# Patient Record
Sex: Male | Born: 1940 | Race: White | Hispanic: No | Smoking: Current every day smoker
Health system: Southern US, Community
[De-identification: ages and names within clinical notes are randomized; demographics above are authoritative.]

## PROBLEM LIST (undated history)

## (undated) DIAGNOSIS — E119 Type 2 diabetes mellitus without complications: Secondary | ICD-10-CM

## (undated) DIAGNOSIS — F0390 Unspecified dementia without behavioral disturbance: Secondary | ICD-10-CM

## (undated) DIAGNOSIS — I10 Essential (primary) hypertension: Secondary | ICD-10-CM

## (undated) DIAGNOSIS — F419 Anxiety disorder, unspecified: Secondary | ICD-10-CM

## (undated) DIAGNOSIS — G8929 Other chronic pain: Secondary | ICD-10-CM

## (undated) DIAGNOSIS — M549 Dorsalgia, unspecified: Secondary | ICD-10-CM

## (undated) HISTORY — PX: APPENDECTOMY: SHX54

---

## 2010-12-18 ENCOUNTER — Emergency Department: Payer: Self-pay | Admitting: Emergency Medicine

## 2010-12-24 ENCOUNTER — Emergency Department: Payer: Self-pay | Admitting: Emergency Medicine

## 2012-03-10 ENCOUNTER — Emergency Department: Payer: Self-pay | Admitting: Emergency Medicine

## 2013-06-28 ENCOUNTER — Emergency Department: Payer: Self-pay | Admitting: Emergency Medicine

## 2013-06-28 LAB — DRUG SCREEN, URINE
Amphetamines, Ur Screen: NEGATIVE (ref ?–1000)
Barbiturates, Ur Screen: NEGATIVE (ref ?–200)
Benzodiazepine, Ur Scrn: NEGATIVE (ref ?–200)
Cannabinoid 50 Ng, Ur ~~LOC~~: NEGATIVE (ref ?–50)
Cocaine Metabolite,Ur ~~LOC~~: NEGATIVE (ref ?–300)
MDMA (ECSTASY) UR SCREEN: NEGATIVE (ref ?–500)
METHADONE, UR SCREEN: NEGATIVE (ref ?–300)
OPIATE, UR SCREEN: POSITIVE (ref ?–300)
Phencyclidine (PCP) Ur S: NEGATIVE (ref ?–25)
Tricyclic, Ur Screen: NEGATIVE (ref ?–1000)

## 2013-06-28 LAB — COMPREHENSIVE METABOLIC PANEL
ALK PHOS: 178 U/L — AB
ALT: 25 U/L (ref 12–78)
AST: 23 U/L (ref 15–37)
Albumin: 3.7 g/dL (ref 3.4–5.0)
Anion Gap: 9 (ref 7–16)
BUN: 13 mg/dL (ref 7–18)
Bilirubin,Total: 0.6 mg/dL (ref 0.2–1.0)
Calcium, Total: 9.7 mg/dL (ref 8.5–10.1)
Chloride: 100 mmol/L (ref 98–107)
Co2: 26 mmol/L (ref 21–32)
Creatinine: 0.98 mg/dL (ref 0.60–1.30)
EGFR (African American): >60
EGFR (Non-African Amer.): >60
Glucose: 190 mg/dL — ABNORMAL HIGH (ref 65–99)
Osmolality: 275 (ref 275–301)
Potassium: 3.9 mmol/L (ref 3.5–5.1)
Sodium: 135 mmol/L — ABNORMAL LOW (ref 136–145)
Total Protein: 7.7 g/dL (ref 6.4–8.2)

## 2013-06-28 LAB — CBC
HCT: 51.8 % (ref 40.0–52.0)
HGB: 17.1 g/dL (ref 13.0–18.0)
MCH: 27.3 pg (ref 26.0–34.0)
MCHC: 33 g/dL (ref 32.0–36.0)
MCV: 83 fL (ref 80–100)
PLATELETS: 241 10*3/uL (ref 150–440)
RBC: 6.25 10*6/uL — ABNORMAL HIGH (ref 4.40–5.90)
RDW: 16 % — ABNORMAL HIGH (ref 11.5–14.5)
WBC: 8.1 10*3/uL (ref 3.8–10.6)

## 2013-06-28 LAB — URINALYSIS, COMPLETE
Bacteria: NONE SEEN
Bilirubin,UR: NEGATIVE
Blood: NEGATIVE
Leukocyte Esterase: NEGATIVE
NITRITE: NEGATIVE
PH: 6 (ref 4.5–8.0)
Specific Gravity: 1.035 (ref 1.003–1.030)
WBC UR: 5 /HPF (ref 0–5)

## 2013-06-28 LAB — SALICYLATE LEVEL: SALICYLATES, SERUM: 3.5 mg/dL — AB

## 2013-06-28 LAB — ACETAMINOPHEN LEVEL

## 2013-06-28 LAB — ETHANOL
Ethanol %: <0.003 % (ref 0.000–0.080)
Ethanol: <3 mg/dL

## 2013-09-01 ENCOUNTER — Emergency Department: Payer: Self-pay | Admitting: Emergency Medicine

## 2013-09-01 LAB — ETHANOL
Ethanol %: <0.003 % (ref 0.000–0.080)
Ethanol: <3 mg/dL

## 2013-09-01 LAB — CBC WITH DIFFERENTIAL/PLATELET
BASOS ABS: 0.1 10*3/uL (ref 0.0–0.1)
BASOS PCT: 0.9 %
EOS PCT: 1.7 %
Eosinophil #: 0.2 10*3/uL (ref 0.0–0.7)
HCT: 51.3 % (ref 40.0–52.0)
HGB: 17 g/dL (ref 13.0–18.0)
LYMPHS ABS: 1.8 10*3/uL (ref 1.0–3.6)
Lymphocyte %: 20.4 %
MCH: 27.4 pg (ref 26.0–34.0)
MCHC: 33.1 g/dL (ref 32.0–36.0)
MCV: 83 fL (ref 80–100)
MONOS PCT: 6.9 %
Monocyte #: 0.6 x10 3/mm (ref 0.2–1.0)
NEUTROS PCT: 70.1 %
Neutrophil #: 6 10*3/uL (ref 1.4–6.5)
Platelet: 294 10*3/uL (ref 150–440)
RBC: 6.19 10*6/uL — ABNORMAL HIGH (ref 4.40–5.90)
RDW: 17.4 % — AB (ref 11.5–14.5)
WBC: 8.6 10*3/uL (ref 3.8–10.6)

## 2013-09-01 LAB — BASIC METABOLIC PANEL
Anion Gap: 7 (ref 7–16)
BUN: 15 mg/dL (ref 7–18)
CHLORIDE: 104 mmol/L (ref 98–107)
Calcium, Total: 8.9 mg/dL (ref 8.5–10.1)
Co2: 23 mmol/L (ref 21–32)
Creatinine: 0.89 mg/dL (ref 0.60–1.30)
EGFR (African American): >60
GLUCOSE: 164 mg/dL — AB (ref 65–99)
Osmolality: 273 (ref 275–301)
Potassium: 3.5 mmol/L (ref 3.5–5.1)
SODIUM: 134 mmol/L — AB (ref 136–145)

## 2013-09-01 LAB — TSH: THYROID STIMULATING HORM: 0.96 u[IU]/mL

## 2013-09-01 LAB — TROPONIN I

## 2013-10-25 ENCOUNTER — Emergency Department: Payer: Self-pay | Admitting: Emergency Medicine

## 2014-11-26 ENCOUNTER — Emergency Department
Admission: EM | Admit: 2014-11-26 | Discharge: 2014-11-26 | Disposition: A | Discharge status: Home / Self Care | Payer: Medicare Other | Attending: Emergency Medicine | Admitting: Emergency Medicine

## 2014-11-26 ENCOUNTER — Encounter: Payer: Self-pay | Admitting: *Deleted

## 2014-11-26 DIAGNOSIS — F419 Anxiety disorder, unspecified: Secondary | ICD-10-CM | POA: Insufficient documentation

## 2014-11-26 HISTORY — DX: Essential (primary) hypertension: I10

## 2014-11-26 HISTORY — DX: Type 2 diabetes mellitus without complications: E11.9

## 2014-11-26 HISTORY — DX: Anxiety disorder, unspecified: F41.9

## 2014-11-26 LAB — CBC WITH DIFFERENTIAL/PLATELET
Basophils Absolute: 0.1 10*3/uL (ref 0–0.1)
Basophils Relative: 1 %
Eosinophils Absolute: 0.2 10*3/uL (ref 0–0.7)
Eosinophils Relative: 2 %
HEMATOCRIT: 46.4 % (ref 40.0–52.0)
Hemoglobin: 15.6 g/dL (ref 13.0–18.0)
LYMPHS ABS: 2.2 10*3/uL (ref 1.0–3.6)
LYMPHS PCT: 23 %
MCH: 28.9 pg (ref 26.0–34.0)
MCHC: 33.7 g/dL (ref 32.0–36.0)
MCV: 85.8 fL (ref 80.0–100.0)
MONO ABS: 0.6 10*3/uL (ref 0.2–1.0)
Monocytes Relative: 6 %
Neutro Abs: 6.9 10*3/uL — ABNORMAL HIGH (ref 1.4–6.5)
Neutrophils Relative %: 68 %
PLATELETS: 214 10*3/uL (ref 150–440)
RBC: 5.41 MIL/uL (ref 4.40–5.90)
RDW: 14.7 % — ABNORMAL HIGH (ref 11.5–14.5)
WBC: 10 10*3/uL (ref 3.8–10.6)

## 2014-11-26 LAB — BASIC METABOLIC PANEL
Anion gap: 10 (ref 5–15)
BUN: 18 mg/dL (ref 6–20)
CO2: 24 mmol/L (ref 22–32)
CREATININE: 0.86 mg/dL (ref 0.61–1.24)
Calcium: 8.6 mg/dL — ABNORMAL LOW (ref 8.9–10.3)
Chloride: 103 mmol/L (ref 101–111)
GFR calc Af Amer: >60 mL/min (ref >60)
GFR calc non Af Amer: >60 mL/min (ref >60)
GLUCOSE: 136 mg/dL — AB (ref 65–99)
Potassium: 3.6 mmol/L (ref 3.5–5.1)
Sodium: 137 mmol/L (ref 135–145)

## 2014-11-26 LAB — TROPONIN I

## 2014-11-26 LAB — BRAIN NATRIURETIC PEPTIDE: B Natriuretic Peptide: 39 pg/mL (ref 0.0–100.0)

## 2014-11-26 MED ORDER — DIAZEPAM 5 MG PO TABS
10.0000 mg | ORAL_TABLET | Freq: Once | ORAL | Status: AC
  Administered 2014-11-26: 10 mg via ORAL

## 2014-11-26 MED ORDER — LORAZEPAM 1 MG PO TABS: 1.0000 mg | ORAL_TABLET | Freq: Three times a day (TID) | ORAL | Status: AC | PRN

## 2014-11-26 MED ORDER — DIAZEPAM 5 MG PO TABS
ORAL_TABLET | ORAL | Status: AC
  Administered 2014-11-26: 10 mg via ORAL
  Filled 2014-11-26: qty 2

## 2014-11-26 NOTE — ED Notes (Signed)
Pt was seen at [facility] yesterday for anxiety, pt called EMS today for anxiety, t is requesting relief from symptoms

## 2014-11-26 NOTE — ED Provider Notes (Signed)
[facility] Emergency Department Provider Note     Time seen: ----------------------------------------- 8:30 AM on 11/26/2014 -----------------------------------------    I have reviewed the triage vital signs and the nursing notes.   HISTORY  Chief Complaint Anxiety    HPI Dennis Haynes is a 74 y.o. male who presents ER for feeling anxious, patient seen at [facility] was given some IM Ativan but he was not given a prescription. Patient feeling very anxious and jittery. Does have history of same, symptoms are severe at this time. Nothing makes it worse. Onset was this morning.   No past medical history on file.  There are no active problems to display for this patient.   No past surgical history on file.  Allergies Review of patient's allergies indicates not on file.  Social History History  Substance Use Topics  . Smoking status: Not on file  . Smokeless tobacco: Not on file  . Alcohol Use: Not on file    Review of Systems Constitutional: Negative for fever. Eyes: Negative for visual changes. ENT: Negative for sore throat. Cardiovascular: Negative for chest pain. Respiratory: Negative for shortness of breath. Gastrointestinal: Negative for abdominal pain, vomiting and diarrhea. Genitourinary: Negative for dysuria. Musculoskeletal: Negative for back pain. Skin: Negative for rash. Neurological: Negative for headaches, focal weakness or numbness. Psychiatric: Positive for anxiety 10-point ROS otherwise negative.  ____________________________________________   PHYSICAL EXAM:  VITAL SIGNS: ED Triage Vitals  Enc Vitals Group     BP --      Pulse --      Resp --      Temp --      Temp src --      SpO2 --      Weight --      Height --      Head Cir --      Peak Flow --      Pain Score --      Pain Loc --      Pain Edu? --      Excl. in [place]? --     Constitutional: Alert, anxious and hyperventilating Eyes: Conjunctivae are  normal. PERRL. Normal extraocular movements. ENT   Head: Normocephalic and atraumatic.   Nose: No congestion/rhinnorhea.   Mouth/Throat: Mucous membranes are moist.   Neck: No stridor. Hematological/Lymphatic/Immunilogical: No cervical lymphadenopathy. Cardiovascular: Normal rate, regular rhythm. Normal and symmetric distal pulses are present in all extremities. No murmurs, rubs, or gallops. Respiratory: Normal respiratory effort without tachypnea nor retractions. Breath sounds are clear and equal bilaterally. No wheezes/rales/rhonchi. Gastrointestinal: Soft and nontender. No distention. No abdominal bruits. There is no CVA tenderness. Musculoskeletal: Nontender with normal range of motion in all extremities. No joint effusions.  No lower extremity tenderness nor edema. Neurologic:  Normal speech and language. No gross focal neurologic deficits are appreciated. Speech is normal. No gait instability. Skin:  Skin is warm, dry and intact. No rash noted. Psychiatric: Anxious mood, speech and behavior are otherwise unremarkable ____________________________________________  EKG: Interpreted by me. Normal sinus rhythm with a rate of 70, normal axis normal intervals, no evidence of hypertrophy or acute infarction.  ____________________________________________  ED COURSE:  Pertinent labs & imaging results that were available during my care of the patient were reviewed by me and considered in my medical decision making (see chart for details). Patient given by mouth Valium, will check basic labs and reevaluate ____________________________________________    LABS (pertinent positives/negatives)  Labs Reviewed  CBC WITH DIFFERENTIAL/PLATELET - Abnormal; Notable for  the following:    RDW 14.7 (*)    Neutro Abs 6.9 (*)    All other components within normal limits  BASIC METABOLIC PANEL - Abnormal; Notable for the following:    Glucose, Bld 136 (*)    Calcium 8.6 (*)    All other  components within normal limits  BRAIN NATRIURETIC PEPTIDE  TROPONIN I    RADIOLOGY  None  ____________________________________________  FINAL ASSESSMENT AND PLAN  Anxiety  Plan: Patient is in no acute distress, feels better after Valium. We'll discharge with Ativan in short supply to take as needed.   Earleen Newport, MD   Earleen Newport, MD 11/26/14 [phone]

## 2014-11-26 NOTE — ED Notes (Signed)
Pt informed to return if any life threatening symptoms occur.  

## 2014-11-26 NOTE — ED Notes (Signed)
Pt complains of feeling anxious, pt seen at [facility] given IM ativan per pt, pt not given prescription for ativan

## 2014-11-26 NOTE — Discharge Instructions (Signed)

## 2015-12-06 ENCOUNTER — Other Ambulatory Visit: Payer: Self-pay | Admitting: Unknown Physician Specialty

## 2015-12-07 ENCOUNTER — Other Ambulatory Visit: Payer: Self-pay | Admitting: Unknown Physician Specialty

## 2015-12-07 DIAGNOSIS — M1711 Unilateral primary osteoarthritis, right knee: Secondary | ICD-10-CM

## 2015-12-27 ENCOUNTER — Ambulatory Visit: Payer: Medicare Other

## 2016-01-08 ENCOUNTER — Ambulatory Visit: Payer: Medicare Other

## 2016-02-05 ENCOUNTER — Emergency Department: Payer: Medicare Other

## 2016-02-05 ENCOUNTER — Inpatient Hospital Stay
Admission: EM | Admit: 2016-02-05 | Discharge: 2016-02-08 | DRG: 682 | Disposition: A | Discharge status: Skilled Nursing Facility | Payer: Medicare Other | Attending: Specialist | Admitting: Specialist

## 2016-02-05 ENCOUNTER — Encounter: Payer: Self-pay | Admitting: Intensive Care

## 2016-02-05 DIAGNOSIS — N390 Urinary tract infection, site not specified: Secondary | ICD-10-CM | POA: Diagnosis present

## 2016-02-05 DIAGNOSIS — K219 Gastro-esophageal reflux disease without esophagitis: Secondary | ICD-10-CM | POA: Diagnosis present

## 2016-02-05 DIAGNOSIS — I10 Essential (primary) hypertension: Secondary | ICD-10-CM | POA: Diagnosis present

## 2016-02-05 DIAGNOSIS — F419 Anxiety disorder, unspecified: Secondary | ICD-10-CM | POA: Diagnosis present

## 2016-02-05 DIAGNOSIS — Z794 Long term (current) use of insulin: Secondary | ICD-10-CM

## 2016-02-05 DIAGNOSIS — M549 Dorsalgia, unspecified: Secondary | ICD-10-CM | POA: Diagnosis present

## 2016-02-05 DIAGNOSIS — F0390 Unspecified dementia without behavioral disturbance: Secondary | ICD-10-CM | POA: Diagnosis present

## 2016-02-05 DIAGNOSIS — R531 Weakness: Secondary | ICD-10-CM | POA: Diagnosis present

## 2016-02-05 DIAGNOSIS — F17210 Nicotine dependence, cigarettes, uncomplicated: Secondary | ICD-10-CM | POA: Diagnosis present

## 2016-02-05 DIAGNOSIS — E11649 Type 2 diabetes mellitus with hypoglycemia without coma: Secondary | ICD-10-CM | POA: Diagnosis present

## 2016-02-05 DIAGNOSIS — N170 Acute kidney failure with tubular necrosis: Secondary | ICD-10-CM | POA: Diagnosis not present

## 2016-02-05 DIAGNOSIS — E860 Dehydration: Secondary | ICD-10-CM | POA: Diagnosis not present

## 2016-02-05 DIAGNOSIS — Z7982 Long term (current) use of aspirin: Secondary | ICD-10-CM

## 2016-02-05 DIAGNOSIS — L8990 Pressure ulcer of unspecified site, unspecified stage: Secondary | ICD-10-CM | POA: Insufficient documentation

## 2016-02-05 DIAGNOSIS — L89893 Pressure ulcer of other site, stage 3: Secondary | ICD-10-CM | POA: Diagnosis present

## 2016-02-05 DIAGNOSIS — Z79899 Other long term (current) drug therapy: Secondary | ICD-10-CM | POA: Diagnosis not present

## 2016-02-05 DIAGNOSIS — E876 Hypokalemia: Secondary | ICD-10-CM | POA: Diagnosis present

## 2016-02-05 DIAGNOSIS — E1140 Type 2 diabetes mellitus with diabetic neuropathy, unspecified: Secondary | ICD-10-CM | POA: Diagnosis present

## 2016-02-05 DIAGNOSIS — G8929 Other chronic pain: Secondary | ICD-10-CM | POA: Diagnosis present

## 2016-02-05 DIAGNOSIS — E119 Type 2 diabetes mellitus without complications: Secondary | ICD-10-CM

## 2016-02-05 DIAGNOSIS — B952 Enterococcus as the cause of diseases classified elsewhere: Secondary | ICD-10-CM | POA: Diagnosis present

## 2016-02-05 DIAGNOSIS — N179 Acute kidney failure, unspecified: Secondary | ICD-10-CM | POA: Diagnosis present

## 2016-02-05 HISTORY — DX: Other chronic pain: G89.29

## 2016-02-05 HISTORY — DX: Dorsalgia, unspecified: M54.9

## 2016-02-05 HISTORY — DX: Unspecified dementia, unspecified severity, without behavioral disturbance, psychotic disturbance, mood disturbance, and anxiety: F03.90

## 2016-02-05 LAB — COMPREHENSIVE METABOLIC PANEL
ALBUMIN: 3.2 g/dL — AB (ref 3.5–5.0)
ALK PHOS: 152 U/L — AB (ref 38–126)
ALT: 20 U/L (ref 17–63)
AST: 28 U/L (ref 15–41)
Anion gap: 14 (ref 5–15)
BUN: 27 mg/dL — ABNORMAL HIGH (ref 6–20)
CALCIUM: 8.1 mg/dL — AB (ref 8.9–10.3)
CHLORIDE: 101 mmol/L (ref 101–111)
CO2: 23 mmol/L (ref 22–32)
Creatinine, Ser: 1.42 mg/dL — ABNORMAL HIGH (ref 0.61–1.24)
GFR calc non Af Amer: 46 mL/min — ABNORMAL LOW (ref >60)
GFR, EST AFRICAN AMERICAN: 54 mL/min — AB (ref >60)
GLUCOSE: 196 mg/dL — AB (ref 65–99)
POTASSIUM: 3.2 mmol/L — AB (ref 3.5–5.1)
Sodium: 138 mmol/L (ref 135–145)
Total Bilirubin: 0.5 mg/dL (ref 0.3–1.2)
Total Protein: 7.4 g/dL (ref 6.5–8.1)

## 2016-02-05 LAB — CBC WITH DIFFERENTIAL/PLATELET
BASOS PCT: 1 %
Basophils Absolute: 0.1 10*3/uL (ref 0–0.1)
EOS ABS: 0.2 10*3/uL (ref 0–0.7)
Eosinophils Relative: 2 %
HCT: 40.3 % (ref 40.0–52.0)
HEMOGLOBIN: 13.5 g/dL (ref 13.0–18.0)
LYMPHS ABS: 1.8 10*3/uL (ref 1.0–3.6)
Lymphocytes Relative: 19 %
MCH: 24.9 pg — AB (ref 26.0–34.0)
MCHC: 33.6 g/dL (ref 32.0–36.0)
MCV: 74 fL — ABNORMAL LOW (ref 80.0–100.0)
Monocytes Absolute: 0.7 10*3/uL (ref 0.2–1.0)
Monocytes Relative: 8 %
NEUTROS ABS: 6.6 10*3/uL — AB (ref 1.4–6.5)
NEUTROS PCT: 70 %
PLATELETS: 328 10*3/uL (ref 150–440)
RBC: 5.44 MIL/uL (ref 4.40–5.90)
RDW: 17.8 % — AB (ref 11.5–14.5)
WBC: 9.4 10*3/uL (ref 3.8–10.6)

## 2016-02-05 LAB — URINALYSIS COMPLETE WITH MICROSCOPIC (ARMC ONLY)
BILIRUBIN URINE: NEGATIVE
Glucose, UA: NEGATIVE mg/dL
Nitrite: NEGATIVE
PH: 5 (ref 5.0–8.0)
PROTEIN: 30 mg/dL — AB
SPECIFIC GRAVITY, URINE: 1.018 (ref 1.005–1.030)
Squamous Epithelial / LPF: NONE SEEN

## 2016-02-05 LAB — GLUCOSE, CAPILLARY: Glucose-Capillary: 216 mg/dL — ABNORMAL HIGH (ref 65–99)

## 2016-02-05 LAB — TROPONIN I: Troponin I: <0.03 ng/mL (ref <0.03)

## 2016-02-05 MED ORDER — SODIUM CHLORIDE 0.9 % IV BOLUS (SEPSIS)
500.0000 mL | Freq: Once | INTRAVENOUS | Status: AC
  Administered 2016-02-05: 500 mL via INTRAVENOUS

## 2016-02-05 MED ORDER — SODIUM CHLORIDE 0.9 % IV SOLN
INTRAVENOUS | Status: AC
  Administered 2016-02-05 – 2016-02-06 (×2): via INTRAVENOUS

## 2016-02-05 MED ORDER — ONDANSETRON HCL 4 MG PO TABS
4.0000 mg | ORAL_TABLET | Freq: Four times a day (QID) | ORAL | Status: DC | PRN
Start: 2016-02-05 — End: 2016-02-08

## 2016-02-05 MED ORDER — ENOXAPARIN SODIUM 40 MG/0.4ML ~~LOC~~ SOLN
40.0000 mg | Freq: Every day | SUBCUTANEOUS | Status: DC
  Administered 2016-02-05 – 2016-02-07 (×3): 40 mg via SUBCUTANEOUS
  Filled 2016-02-05 (×3): qty 0.4

## 2016-02-05 MED ORDER — OXYCODONE HCL 5 MG PO TABS
5.0000 mg | ORAL_TABLET | Freq: Once | ORAL | Status: AC
  Administered 2016-02-05: 5 mg via ORAL
  Filled 2016-02-05: qty 1

## 2016-02-05 MED ORDER — POTASSIUM CHLORIDE 20 MEQ PO PACK
PACK | ORAL | Status: AC
  Administered 2016-02-05: 40 meq via ORAL
  Filled 2016-02-05: qty 2

## 2016-02-05 MED ORDER — SERTRALINE HCL 100 MG PO TABS
100.0000 mg | ORAL_TABLET | Freq: Every day | ORAL | Status: DC
  Administered 2016-02-06 – 2016-02-08 (×3): 100 mg via ORAL
  Filled 2016-02-05 (×3): qty 1

## 2016-02-05 MED ORDER — METOPROLOL TARTRATE 50 MG PO TABS
50.0000 mg | ORAL_TABLET | Freq: Two times a day (BID) | ORAL | Status: DC
  Administered 2016-02-05 – 2016-02-08 (×6): 50 mg via ORAL
  Filled 2016-02-05 (×6): qty 1

## 2016-02-05 MED ORDER — ONDANSETRON HCL 4 MG/2ML IJ SOLN: 4.0000 mg | Freq: Four times a day (QID) | INTRAMUSCULAR | Status: DC | PRN

## 2016-02-05 MED ORDER — CEFTRIAXONE SODIUM 1 G IJ SOLR
500.0000 mg | Freq: Once | INTRAMUSCULAR | Status: AC
  Administered 2016-02-05: 500 mg via INTRAMUSCULAR
  Filled 2016-02-05: qty 10

## 2016-02-05 MED ORDER — PANTOPRAZOLE SODIUM 40 MG PO TBEC
40.0000 mg | DELAYED_RELEASE_TABLET | Freq: Every day | ORAL | Status: DC
  Administered 2016-02-06 – 2016-02-08 (×3): 40 mg via ORAL
  Filled 2016-02-05 (×4): qty 1

## 2016-02-05 MED ORDER — INSULIN ASPART 100 UNIT/ML ~~LOC~~ SOLN
0.0000 [IU] | Freq: Every day | SUBCUTANEOUS | Status: DC
  Administered 2016-02-05 – 2016-02-06 (×2): 2 [IU] via SUBCUTANEOUS
  Administered 2016-02-07: 3 [IU] via SUBCUTANEOUS
  Filled 2016-02-05: qty 2

## 2016-02-05 MED ORDER — INSULIN ASPART 100 UNIT/ML ~~LOC~~ SOLN
0.0000 [IU] | Freq: Three times a day (TID) | SUBCUTANEOUS | Status: DC
  Administered 2016-02-06 – 2016-02-07 (×2): 1 [IU] via SUBCUTANEOUS
  Administered 2016-02-07: 3 [IU] via SUBCUTANEOUS
  Administered 2016-02-07: 2 [IU] via SUBCUTANEOUS
  Administered 2016-02-08: 1 [IU] via SUBCUTANEOUS
  Filled 2016-02-05: qty 1
  Filled 2016-02-05: qty 3
  Filled 2016-02-05: qty 1
  Filled 2016-02-05: qty 2
  Filled 2016-02-05: qty 3

## 2016-02-05 MED ORDER — ASPIRIN EC 81 MG PO TBEC
81.0000 mg | DELAYED_RELEASE_TABLET | Freq: Every day | ORAL | Status: DC
  Administered 2016-02-06 – 2016-02-08 (×3): 81 mg via ORAL
  Filled 2016-02-05 (×3): qty 1

## 2016-02-05 MED ORDER — OXYCODONE HCL 5 MG PO TABS
ORAL_TABLET | ORAL | Status: AC
  Filled 2016-02-05: qty 1

## 2016-02-05 MED ORDER — OXYCODONE HCL 5 MG PO TABS
5.0000 mg | ORAL_TABLET | Freq: Once | ORAL | Status: AC
  Administered 2016-02-05: 5 mg via ORAL

## 2016-02-05 MED ORDER — ATORVASTATIN CALCIUM 20 MG PO TABS
80.0000 mg | ORAL_TABLET | Freq: Every day | ORAL | Status: DC
  Administered 2016-02-06 – 2016-02-08 (×3): 80 mg via ORAL
  Filled 2016-02-05 (×3): qty 4

## 2016-02-05 MED ORDER — HYDROCHLOROTHIAZIDE 25 MG PO TABS
25.0000 mg | ORAL_TABLET | Freq: Every day | ORAL | Status: DC
  Administered 2016-02-06 – 2016-02-08 (×3): 25 mg via ORAL
  Filled 2016-02-05 (×3): qty 1

## 2016-02-05 MED ORDER — OXYCODONE-ACETAMINOPHEN 10-325 MG PO TABS: 1.0000 | ORAL_TABLET | Freq: Four times a day (QID) | ORAL | Status: DC | PRN

## 2016-02-05 MED ORDER — INSULIN ASPART 100 UNIT/ML ~~LOC~~ SOLN
SUBCUTANEOUS | Status: AC
  Filled 2016-02-05: qty 2

## 2016-02-05 MED ORDER — DEXTROSE 5 % IV SOLN
500.0000 mg | Freq: Once | INTRAVENOUS | Status: AC
  Administered 2016-02-05: 500 mg via INTRAVENOUS
  Filled 2016-02-05: qty 500

## 2016-02-05 MED ORDER — OXYCODONE HCL 5 MG PO TABS
10.0000 mg | ORAL_TABLET | Freq: Four times a day (QID) | ORAL | Status: DC | PRN
  Administered 2016-02-05 – 2016-02-06 (×2): 10 mg via ORAL
  Filled 2016-02-05 (×2): qty 2

## 2016-02-05 MED ORDER — ALPRAZOLAM 0.5 MG PO TABS
0.5000 mg | ORAL_TABLET | Freq: Three times a day (TID) | ORAL | Status: DC | PRN
  Administered 2016-02-05 – 2016-02-07 (×3): 0.5 mg via ORAL
  Filled 2016-02-05 (×3): qty 1

## 2016-02-05 MED ORDER — INSULIN ASPART 100 UNIT/ML ~~LOC~~ SOLN: 18.0000 [IU] | Freq: Three times a day (TID) | SUBCUTANEOUS | Status: DC

## 2016-02-05 MED ORDER — ACETAMINOPHEN 325 MG PO TABS: 325.0000 mg | ORAL_TABLET | Freq: Four times a day (QID) | ORAL | Status: DC | PRN

## 2016-02-05 MED ORDER — POTASSIUM CHLORIDE 20 MEQ PO PACK
40.0000 meq | PACK | Freq: Once | ORAL | Status: AC
  Administered 2016-02-05: 40 meq via ORAL

## 2016-02-05 MED ORDER — CEPHALEXIN 500 MG PO CAPS: 500.0000 mg | ORAL_CAPSULE | Freq: Three times a day (TID) | ORAL | 0 refills | Status: DC

## 2016-02-05 MED ORDER — POTASSIUM CHLORIDE CRYS ER 20 MEQ PO TBCR
40.0000 meq | EXTENDED_RELEASE_TABLET | Freq: Once | ORAL | Status: AC
  Administered 2016-02-05: 40 meq via ORAL
  Filled 2016-02-05: qty 2

## 2016-02-05 MED ORDER — PREGABALIN 75 MG PO CAPS
300.0000 mg | ORAL_CAPSULE | Freq: Two times a day (BID) | ORAL | Status: DC
  Administered 2016-02-06 – 2016-02-08 (×5): 300 mg via ORAL
  Filled 2016-02-05 (×5): qty 4

## 2016-02-05 MED ORDER — ZOLPIDEM TARTRATE 5 MG PO TABS
5.0000 mg | ORAL_TABLET | Freq: Every evening | ORAL | Status: DC | PRN
  Administered 2016-02-05: 5 mg via ORAL
  Filled 2016-02-05: qty 1

## 2016-02-05 MED ORDER — RISPERIDONE 0.25 MG PO TABS
0.2500 mg | ORAL_TABLET | Freq: Two times a day (BID) | ORAL | Status: DC
  Administered 2016-02-06 – 2016-02-08 (×5): 0.25 mg via ORAL
  Filled 2016-02-05 (×7): qty 1

## 2016-02-05 MED ORDER — TRAMADOL HCL 50 MG PO TABS
50.0000 mg | ORAL_TABLET | Freq: Four times a day (QID) | ORAL | Status: DC | PRN
  Administered 2016-02-06 – 2016-02-08 (×6): 50 mg via ORAL
  Filled 2016-02-05 (×6): qty 1

## 2016-02-05 MED ORDER — DEXTROSE 5 % IV SOLN
1.0000 g | INTRAVENOUS | Status: DC
  Administered 2016-02-06 – 2016-02-07 (×2): 1 g via INTRAVENOUS
  Filled 2016-02-05 (×3): qty 10

## 2016-02-05 MED ORDER — MIRTAZAPINE 15 MG PO TABS
30.0000 mg | ORAL_TABLET | Freq: Every day | ORAL | Status: DC
  Administered 2016-02-05 – 2016-02-07 (×3): 30 mg via ORAL
  Filled 2016-02-05 (×3): qty 2

## 2016-02-05 MED ORDER — INSULIN DETEMIR 100 UNIT/ML ~~LOC~~ SOLN
64.0000 [IU] | Freq: Every day | SUBCUTANEOUS | Status: DC
  Administered 2016-02-06: 64 [IU] via SUBCUTANEOUS
  Filled 2016-02-05 (×2): qty 0.64

## 2016-02-05 NOTE — ED Triage Notes (Signed)
Patient arrived by EMS from home with c/o weakness. Patient was able to ambulate with assistance X3 with EMS. Patient states he hasnt been able to ambulate X6 weeks. Pt A&O x4. Has HX diabetes. Pressure ulcer is present on L great toe.

## 2016-02-05 NOTE — ED Provider Notes (Signed)
[facility] Emergency Department Provider Note   ____________________________________________   First MD Initiated Contact with Patient 02/05/16 1345     (approximate)  I have reviewed the triage vital signs and the nursing notes.   HISTORY  Chief Complaint Weakness    HPI Dennis Haynes is a 75 y.o. 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 Patient reports that he lives independently, over the past month he has been very weak, he does not have access to food unless his alcoholic brother brings him food and so he often goes several days without food or drink. Today he needed help getting off of the toilet and his brother decided to bring him to the ER. He reports he is having chronic bilateral rib pain which responds to percocet but this is not unusual for him. He denies any abdominal pain, or vomiting. He reports that he has had nonbloody diarrhea today related to a laxative that he took 2 or 3 days ago for treatment of constipation. No fevers or chills. No recent falls. No headache.   Past Medical History:  Diagnosis Date  . Anxiety   . Diabetes ([place])   . Hypertension     There are no active problems to display for this patient.   Past Surgical History:  Procedure Laterality Date  . APPENDECTOMY      Prior to Admission medications   Medication Sig Start Date End Date Taking? Authorizing Provider  insulin aspart (NOVOLOG) 100 UNIT/ML FlexPen Inject 18 Units into the skin 3 (three) times daily with meals. Plus up to an additional 20 units 3 times daily according to sliding scale. 12/15/15  Yes Historical Provider, MD  aspirin EC 81 MG tablet Take 1 tablet by mouth daily.    Historical Provider, MD  Insulin Detemir (LEVEMIR FLEXPEN) 100 UNIT/ML Pen  Inject 64 Units into the skin at bedtime.    Historical Provider, MD    Allergies Review of patient's allergies indicates no known allergies.  History reviewed. No pertinent family history.  Social History Social History  Substance Use Topics  . Smoking status: Current Every Day Smoker    Packs/day: 0.50    Types: Cigarettes  . Smokeless tobacco: Never Used  . Alcohol use No    Review of Systems Constitutional: No fever/chills Eyes: No visual changes. ENT: No sore throat. Cardiovascular: +chronic chest pain. Respiratory: Denies shortness of breath. Gastrointestinal: No abdominal pain.  No nausea, no vomiting.  + diarrhea.  No constipation. Genitourinary: Negative for dysuria. Musculoskeletal: Negative for back pain. Skin: Negative for rash. Neurological: Negative for headaches, focal weakness or numbness.  10-point ROS otherwise negative.  ____________________________________________   PHYSICAL EXAM:  VITAL SIGNS: ED Triage Vitals  Enc Vitals Group     BP 02/05/16 1315 131/79     Pulse Rate 02/05/16 1315 80     Resp 02/05/16 1315 15     Temp 02/05/16 1315 98.2 F (36.8 C)     Temp Source 02/05/16 1315 Oral     SpO2 02/05/16 1315 95 %     Weight 02/05/16 1325 204 lb 9.6 oz (92.8 kg)     Height 02/05/16 1325 5\' 10"  (1.778 m)     Head Circumference --      Peak Flow --      Pain Score 02/05/16 1325 10  Pain Loc --      Pain Edu? --      Excl. in [place]? --     Constitutional: Alert and oriented. Well appearing and in no acute distress. Eyes: Conjunctivae are normal. PERRL. EOMI. Head: Atraumatic. Nose: No congestion/rhinnorhea. Mouth/Throat: Mucous membranes are moist.  Oropharynx non-erythematous. Neck: No stridor.  Supple without meningismus. Cardiovascular: Normal rate, regular rhythm. Grossly normal heart sounds.  Good peripheral circulation. Respiratory: Normal respiratory effort.  No retractions. Lungs CTAB. Gastrointestinal: Soft and nontender. No  distention.  No CVA tenderness. Genitourinary: Deferred Musculoskeletal: No lower extremity tenderness nor edema.  No joint effusions. Small shallow ulceration of the left great toe. Neurologic:  Normal speech and language. No gross focal neurologic deficits are appreciated. 5 out of 5 strength distally in bilateral arms and legs, sensation intact to light touch throughout. Patient has difficulty lifting the right leg at the trunk against resistance but he reports that this is secondary to chronic pain in the right knee, his distal strength is intact. Skin:  Skin is warm, dry and intact. No rash noted. Psychiatric: Mood and affect are normal. Speech and behavior are normal.  ____________________________________________   LABS (all labs ordered are listed, but only abnormal results are displayed)  Labs Reviewed  CBC WITH DIFFERENTIAL/PLATELET - Abnormal; Notable for the following:       Result Value   MCV 74.0 (*)    MCH 24.9 (*)    RDW 17.8 (*)    Neutro Abs 6.6 (*)    All other components within normal limits  COMPREHENSIVE METABOLIC PANEL - Abnormal; Notable for the following:    Potassium 3.2 (*)    Glucose, Bld 196 (*)    BUN 27 (*)    Creatinine, Ser 1.42 (*)    Calcium 8.1 (*)    Albumin 3.2 (*)    Alkaline Phosphatase 152 (*)    GFR calc non Af Amer 46 (*)    GFR calc Af Amer 54 (*)    All other components within normal limits  TROPONIN I  URINALYSIS COMPLETEWITH MICROSCOPIC ([facility] ONLY)   ____________________________________________  EKG  ED ECG REPORT I, Joanne Gavel, the attending physician, personally viewed and interpreted this ECG.   Date: 02/05/2016  EKG Time: 14:48  Rate: 76  Rhythm: normal sinus rhythm with PACs  Axis: normal  Intervals:none  ST&T Change: No Acute ST elevation or acute ST depression.  ____________________________________________  RADIOLOGY  CXR IMPRESSION:  1. Old bilateral healed rib fractures. Thoracic spondylosis. No  acute  findings.  2. Atherosclerotic calcification of the aortic arch.      ____________________________________________   PROCEDURES  Procedure(s) performed: None  Procedures  Critical Care performed: No  ____________________________________________   INITIAL IMPRESSION / ASSESSMENT AND PLAN / ED COURSE  Pertinent labs & imaging results that were available during my care of the patient were reviewed by me and considered in my medical decision making (see chart for details).  Dennis Haynes is a 75 y.o. 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 On exam, he is generally well-appearing and in no acute distress. His vital signs are stable and he is afebrile. He has a nonfocal neurological examination. His EKG is not consistent with ischemia. CBC generally unremarkable, negative troponin. CMP shows mild creatinine elevation of 1.42, IV fluids ordered. Chest x-ray shows no acute cardio pulmonary disease, old bilateral  rib fractures are noted. Urinalysis is pending. I have placed request for social work as well as physical therapy consults. Care transferred to Dr. Quentin Cornwall at 3:30 PM  Clinical Course     ____________________________________________   FINAL CLINICAL IMPRESSION(S) / ED DIAGNOSES  Final diagnoses:  Generalized weakness      NEW MEDICATIONS STARTED DURING THIS VISIT:  New Prescriptions   No medications on file     Note:  This document was prepared using Dragon voice recognition software and may include unintentional dictation errors.    Joanne Gavel, MD 02/05/16 1539

## 2016-02-05 NOTE — ED Notes (Signed)
Pt refused to wear a brief. Pt with t-shirt from home on and gown covering him. Pt refused to have hands cleaned up as well.

## 2016-02-05 NOTE — ED Provider Notes (Addendum)
Patient received in signout from Dr. Edd Fabian.  Patient with chronic weakness and several weeks of difficulty ambulating. No evidence of CVA or focal neurologic deficits on exam. Likely deconditioning. No evidence of acute infectious process. Patient mildly dehydrated and given IV fluids for dehydration here in the ER. He remains hemodynamically stable. Discussed with case manager and social work methods to further assist patient at home. Patient agreeable to home PT. Fourth of been filled out and currently working with arranging home health. Patient currently stable for discharge home.  Have discussed with the patient and available family all diagnostics and treatments performed thus far and all questions were answered to the best of my ability. The patient demonstrates understanding and agreement with plan.    ----------------------------------------- 7:52 PM on 02/05/2016 -----------------------------------------  Patient being picked up by EMS and now stating that he does not feel safe returning home and unable to care for himself. EMS stating that the patient was found in his bed covered in his own stool and urine. Based on this report will speak with hospitalist for admission given evidence of acute symptomatically UTI, dehydration, fall risk and inability to ambulate.  Have discussed with the patient and available family all diagnostics and treatments performed thus far and all questions were answered to the best of my ability. The patient demonstrates understanding and agreement with plan. Merlyn Lot, MD 02/05/16 1952    Merlyn Lot, MD 02/05/16 2001

## 2016-02-05 NOTE — ED Notes (Signed)
Pt provided graham crackers and peanut butter with water

## 2016-02-05 NOTE — ED Notes (Signed)
Lab called to add on urine culture. They stated they would run test.

## 2016-02-05 NOTE — ED Notes (Signed)
Transporting to room 146-1A

## 2016-02-05 NOTE — H&P (Signed)
[facility] [place] NAME: Dennis Haynes    MR#:  [id number]  DATE OF BIRTH:  [birth year]  DATE OF ADMISSION:  02/05/2016  PRIMARY CARE PHYSICIAN: Laurell Josephs, MD   REQUESTING/REFERRING PHYSICIAN: Dr. Quentin Cornwall  CHIEF COMPLAINT:  Generalized weakness with difficulty ambulation and diarrhea  HISTORY OF PRESENT ILLNESS:  Dennis Haynes  is a 75 y.o. male with a known history of Insulin-dependent diabetes mellitus, essential hypertension, anxiety is presenting to the ED with a chief complaint of generalized weakness and difficulty with ambulation. Reporting two-day history of diarrhea and his last dose bowel movement was today a.m. after that he stopped having diarrhea but feeling very weak and tired. In the emergency department UA is abnormal, patient was given IV Rocephin and hospitalist team is called to admit the patient.  PAST MEDICAL HISTORY:   Past Medical History:  Diagnosis Date  . Anxiety   . Diabetes ([place])   . Hypertension     PAST SURGICAL HISTOIRY:   Past Surgical History:  Procedure Laterality Date  . APPENDECTOMY      SOCIAL HISTORY:   Social History  Substance Use Topics  . Smoking status: Current Every Day Smoker    Packs/day: 0.50    Types: Cigarettes  . Smokeless tobacco: Never Used  . Alcohol use No    FAMILY HISTORY:  History reviewed. No pertinent family history.  DRUG ALLERGIES:  No Known Allergies  REVIEW OF SYSTEMS:  CONSTITUTIONAL: No fever, fatigue .Reporting generalized weakness.  EYES: No blurred or double vision.  EARS, NOSE, AND THROAT: No tinnitus or ear pain.  RESPIRATORY: No cough, shortness of breath, wheezing or hemoptysis.  CARDIOVASCULAR: No chest pain, orthopnea, edema.  GASTROINTESTINAL: No nausea, vomiting, reporting two-day history of diarrhea but denies abdominal pain.  GENITOURINARY: No dysuria, hematuria.  ENDOCRINE: No polyuria, nocturia,  HEMATOLOGY: No anemia, easy  bruising or bleeding SKIN: No rash or lesion. MUSCULOSKELETAL: No joint pain or arthritis.   NEUROLOGIC: No tingling, numbness, weakness.  PSYCHIATRY: No anxiety or depression.   MEDICATIONS AT HOME:   Prior to Admission medications   Medication Sig Start Date End Date Taking? Authorizing Provider  ALPRAZolam Duanne Moron) 0.5 MG tablet Take 0.5 mg by mouth 3 (three) times daily as needed for anxiety.   Yes Historical Provider, MD  atorvastatin (LIPITOR) 80 MG tablet Take 80 mg by mouth daily.   Yes Historical Provider, MD  hydrochlorothiazide (HYDRODIURIL) 25 MG tablet Take 25 mg by mouth daily.   Yes Historical Provider, MD  insulin aspart (NOVOLOG) 100 UNIT/ML FlexPen Inject 18 Units into the skin 3 (three) times daily with meals. Plus up to an additional 20 units 3 times daily according to sliding scale. 12/15/15  Yes Historical Provider, MD  metFORMIN (GLUCOPHAGE) 1000 MG tablet Take 1,000-1,500 mg by mouth 2 (two) times daily with a meal. Take 1.5 tabs every morning and 1 tab every evening.   Yes Historical Provider, MD  metoprolol (LOPRESSOR) 50 MG tablet Take 50 mg by mouth 2 (two) times daily.   Yes Historical Provider, MD  mirtazapine (REMERON) 30 MG tablet Take 30 mg by mouth at bedtime.   Yes Historical Provider, MD  omeprazole (PRILOSEC) 40 MG capsule Take 40 mg by mouth daily.   Yes Historical Provider, MD  oxyCODONE-acetaminophen (PERCOCET) 10-325 MG tablet Take 1 tablet by mouth every 6 (six) hours as needed for pain.   Yes Historical Provider, MD  pregabalin (LYRICA) 150 MG capsule Take 300  mg by mouth 2 (two) times daily.    Yes Historical Provider, MD  risperiDONE (RISPERDAL) 0.25 MG tablet Take 0.25 mg by mouth 2 (two) times daily.   Yes Historical Provider, MD  sertraline (ZOLOFT) 100 MG tablet Take 100 mg by mouth daily.   Yes Historical Provider, MD  traMADol (ULTRAM) 50 MG tablet Take 20-100 mg by mouth every 6 (six) hours as needed for moderate pain.   Yes Historical Provider,  MD  zolpidem (AMBIEN) 5 MG tablet Take 5 mg by mouth at bedtime as needed for sleep.   Yes Historical Provider, MD  aspirin EC 81 MG tablet Take 1 tablet by mouth daily.    Historical Provider, MD  cephALEXin (KEFLEX) 500 MG capsule Take 1 capsule (500 mg total) by mouth 3 (three) times daily. 02/05/16 02/15/16  Merlyn Lot, MD  Insulin Detemir (LEVEMIR FLEXPEN) 100 UNIT/ML Pen Inject 64 Units into the skin at bedtime.    Historical Provider, MD      VITAL SIGNS:  Blood pressure (!) 156/94, pulse 93, temperature 98.2 F (36.8 C), temperature source Oral, resp. rate 18, height 5\' 10"  (1.778 m), weight 92.8 kg (204 lb 9.6 oz), SpO2 96 %.  PHYSICAL EXAMINATION:  GENERAL:  75 y.o.-year-old patient lying in the bed with no acute distress.  EYES: Pupils equal, round, reactive to light and accommodation. No scleral icterus. Extraocular muscles intact.  HEENT: Head atraumatic, normocephalic. Oropharynx and nasopharynx clear. Dry mucous membranes NECK:  Supple, no jugular venous distention. No thyroid enlargement, no tenderness.  LUNGS: Normal breath sounds bilaterally, no wheezing, rales,rhonchi or crepitation. No use of accessory muscles of respiration.  CARDIOVASCULAR: S1, S2 normal. No murmurs, rubs, or gallops.  ABDOMEN: Soft, nontender, nondistended. Bowel sounds present. No organomegaly or mass.  EXTREMITIES: No pedal edema, cyanosis, or clubbing.  NEUROLOGIC: Cranial nerves II through XII are intact. Muscle strength 5/5 in all extremities. Sensation intact. Gait not checked.  PSYCHIATRIC: The patient is alert and oriented x 3.  SKIN: No obvious rash, lesion, or ulcer.   LABORATORY PANEL:   CBC  Recent Labs Lab 02/05/16 1336  WBC 9.4  HGB 13.5  HCT 40.3  PLT 328   ------------------------------------------------------------------------------------------------------------------  Chemistries   Recent Labs Lab 02/05/16 1336  NA 138  K 3.2*  CL 101  CO2 23  GLUCOSE 196*   BUN 27*  CREATININE 1.42*  CALCIUM 8.1*  AST 28  ALT 20  ALKPHOS 152*  BILITOT 0.5   ------------------------------------------------------------------------------------------------------------------  Cardiac Enzymes  Recent Labs Lab 02/05/16 1336  TROPONINI <0.03   ------------------------------------------------------------------------------------------------------------------  RADIOLOGY:  Dg Chest 2 View  Result Date: 02/05/2016 CLINICAL DATA:  Weakness EXAM: CHEST  2 VIEW COMPARISON:  03/10/2012 thoracic spine radiographs FINDINGS: Atherosclerotic aortic arch.  Heart size within normal limits. Thoracic spondylosis. Right posterolateral sixth rib deformities suggesting old fracture. Old left healed rib fractures noted laterally. No pleural effusion. IMPRESSION: 1. Old bilateral healed rib fractures. Thoracic spondylosis. No acute findings. 2. Atherosclerotic calcification of the aortic arch. Electronically Signed   By: Van Clines M.D.   On: 02/05/2016 14:28    EKG:   Orders placed or performed during the hospital encounter of 02/05/16  . ED EKG  . ED EKG    IMPRESSION AND PLAN:   Dennis Haynes  is a 75 y.o. male with a known history of Insulin-dependent diabetes mellitus, essential hypertension, anxiety is presenting to the ED with a chief complaint of generalized weakness and difficulty with ambulation. Reporting two-day  history of diarrhea and his last dose bowel movement was today a.m. after that he stopped having diarrhea but feeling very weak and tired. In the emergency department UA is abnormal,  # Acute kidney injury secondary to dehydration from diarrhea Admit to MedSurg unit Patient reports diarrhea resolved today but had it for the past 2 days-probably viral Provide IV fluids and avoid nephrotoxins Monitor renal function closely. Check BMP in a.m. Will do stool tests if diarrhea recurs  #UTI Patient is started on IV Rocephin Urine cultures are  pending Provide IV fluids  #Insulin requiring diabetes mellitus Resume his home dose of NovoLog 3 times a day and resume her 64 units daily at bedtime hold metformin   check hemoglobin A1c and provide sliding scale insulin   #Essential hypertension Resume his home medications Lopressor and hydrochlorothiazide  #Generalized weakness and ambulatory difficulties PT consult for possible placement    All the records are reviewed and case discussed with ED provider. Management plans discussed with the patient, family and they are in agreement.  CODE STATUS: fc/son  TOTAL TIME TAKING CARE OF THIS PATIENT: 45 minutes.   Note: This dictation was prepared with Dragon dictation along with smaller phrase technology. Any transcriptional errors that result from this process are unintentional.  Nicholes Mango M.D on 02/05/2016 at 9:07 PM  Between 7am to 6pm - Pager - [phone]  After 6pm go to [email or web address] - password [email or web address] [facility]  Office  [phone]  CC: Primary care physician; Laurell Josephs, MD

## 2016-02-05 NOTE — Care Management (Signed)
Patient presented to the ED with weakness.  Informed by social worker that there was a consult for csw for placement issues.  Informed that patient needs home health services because he is not eligible for placement  covered by medicare.  Patient lives with his brother.  This was an arrangement that was made so each could help each other but it has not worked out because his brother drinks.  Patient has been experiencing increasing weakness over the last few weeks .  He has a cane, rollator and electric scooter.  He is followed by [facility] [id number]- Dr Marygrace Drought.  Says he was seen about 6 weeks ago.  Patient is in agreement with home health services.  Obtained order from Dr Edd Fabian for SN PT OT Aide and SW.  Also obtained face to face.  Contacted [facility] and informed of the ED visit and the need for home health services and received the verbal okay.  Patient verbalizes "I am starting to have some problems with transportation."    At present, patient has not been disposed.  Awaiting urinalysis results.  Patient is slightly dehydrated and receiving IVFs.

## 2016-02-05 NOTE — Clinical Social Work Note (Signed)
Clinical Social Work Assessment  Patient Details  Name: Dennis Haynes MRN: [id number] Date of Birth: 04/06/1941  Date of referral:  02/05/16               Reason for consult:  Facility Placement                Permission sought to share information with:  Facility Sport and exercise psychologist, Family Supports Permission granted to share information::  Yes, Verbal Permission Granted  Name::        Agency::     Relationship::     Contact Information:     Housing/Transportation Living arrangements for the past 2 months:  Single Family Home Source of Information:  Patient Patient Interpreter Needed:  None Criminal Activity/Legal Involvement Pertinent to Current Situation/Hospitalization:  No - Comment as needed Significant Relationships:  Adult Children, Siblings Lives with:  Self, Siblings Do you feel safe going back to the place where you live?  Yes Need for family participation in patient care:  No (Coment)  Care giving concerns: Pt is currently unable to live independently.   Social Worker assessment / plan:  CSW received consult for possible facility placement. CSW engaged with pt at his bedside. CSW introduced herself and her role as a Education officer, museum. Pt explains that he has his own home and his brother moved in with him a while back. Pt states that the living arrangement was working alright at first but his brother is an alcoholic and his behaviors have become unmanageable. Pt states that his brother used to be available to help him move around and do things around the house. However, now his brother is either unwilling to help or of little use when he does try to help. Pt explains that he has 4 kids, all of which are not involved in his care.  Pt states that he is no longer able to ambulate on his own and he expressed interest in possible rehab or at home PT. CSW explained that with pt's insurance, a SNF placement would not be possible unless pt has a 3 day qualifying stay in the hospital.  However, CSW does think that pt would be a good candidate for Home Health services. RN Case Manager notified. No further social work needs at this time. However, CSW is available should another need arise.  Employment status:  Retired Forensic scientist:  Medicare PT Recommendations:  Not assessed at this time [place] / Referral to community resources:  Other (Comment Required) ([place])  Patient/Family's Response to care: Pt is interested in home health.  Patient/Family's Understanding of and Emotional Response to Diagnosis, Current Treatment, and Prognosis: Pt is appreciative of the care provided by CSW at this time.  Emotional Assessment Appearance:  Appears younger than stated age Attitude/Demeanor/Rapport:  Other (Cooperative) Affect (typically observed):  Anxious, Pleasant Orientation:  Oriented to Self, Oriented to Place, Oriented to  Time, Oriented to Situation Alcohol / Substance use:  Not Applicable Psych involvement (Current and /or in the community):  No (Comment)  Discharge Needs  Concerns to be addressed:  Care Coordination Readmission within the last 30 days:  No Current discharge risk:  Dependent with Mobility Barriers to Discharge:  Unsafe home situation   Georga Kaufmann, [place] 02/05/2016, 4:18 PM

## 2016-02-05 NOTE — ED Notes (Signed)
Pt urinated small amount into urinal by sitting up on bed.

## 2016-02-06 LAB — CBC
HCT: 40.4 % (ref 40.0–52.0)
Hemoglobin: 13.3 g/dL (ref 13.0–18.0)
MCH: 24.2 pg — AB (ref 26.0–34.0)
MCHC: 32.8 g/dL (ref 32.0–36.0)
MCV: 73.7 fL — ABNORMAL LOW (ref 80.0–100.0)
PLATELETS: 328 10*3/uL (ref 150–440)
RBC: 5.48 MIL/uL (ref 4.40–5.90)
RDW: 17.9 % — ABNORMAL HIGH (ref 11.5–14.5)
WBC: 10.7 10*3/uL — ABNORMAL HIGH (ref 3.8–10.6)

## 2016-02-06 LAB — BASIC METABOLIC PANEL
Anion gap: 11 (ref 5–15)
BUN: 17 mg/dL (ref 6–20)
CO2: 26 mmol/L (ref 22–32)
Calcium: 7.8 mg/dL — ABNORMAL LOW (ref 8.9–10.3)
Chloride: 104 mmol/L (ref 101–111)
Creatinine, Ser: 0.96 mg/dL (ref 0.61–1.24)
GFR calc non Af Amer: >60 mL/min (ref >60)
Glucose, Bld: 72 mg/dL (ref 65–99)
Potassium: 3.2 mmol/L — ABNORMAL LOW (ref 3.5–5.1)
SODIUM: 141 mmol/L (ref 135–145)

## 2016-02-06 LAB — GLUCOSE, CAPILLARY
GLUCOSE-CAPILLARY: 61 mg/dL — AB (ref 65–99)
GLUCOSE-CAPILLARY: 84 mg/dL (ref 65–99)
GLUCOSE-CAPILLARY: 113 mg/dL — AB (ref 65–99)
GLUCOSE-CAPILLARY: 211 mg/dL — AB (ref 65–99)
Glucose-Capillary: 110 mg/dL — ABNORMAL HIGH (ref 65–99)
Glucose-Capillary: 148 mg/dL — ABNORMAL HIGH (ref 65–99)

## 2016-02-06 LAB — HEMOGLOBIN A1C: HEMOGLOBIN A1C: 9.8 % — AB (ref 4.0–6.0)

## 2016-02-06 MED ORDER — OXYCODONE HCL 5 MG PO TABS
10.0000 mg | ORAL_TABLET | Freq: Four times a day (QID) | ORAL | Status: DC | PRN
  Administered 2016-02-07 – 2016-02-08 (×5): 10 mg via ORAL
  Filled 2016-02-06 (×5): qty 2

## 2016-02-06 MED ORDER — ACETAMINOPHEN 325 MG PO TABS
325.0000 mg | ORAL_TABLET | Freq: Four times a day (QID) | ORAL | Status: DC | PRN
  Filled 2016-02-06: qty 1

## 2016-02-06 MED ORDER — INSULIN DETEMIR 100 UNIT/ML ~~LOC~~ SOLN
32.0000 [IU] | Freq: Every day | SUBCUTANEOUS | Status: DC
Start: 2016-02-06 — End: 2016-02-08
  Administered 2016-02-06 – 2016-02-07 (×2): 32 [IU] via SUBCUTANEOUS
  Filled 2016-02-06 (×3): qty 0.32

## 2016-02-06 MED ORDER — POTASSIUM CHLORIDE CRYS ER 20 MEQ PO TBCR
20.0000 meq | EXTENDED_RELEASE_TABLET | Freq: Two times a day (BID) | ORAL | Status: DC
Start: 2016-02-06 — End: 2016-02-08
  Administered 2016-02-06 – 2016-02-08 (×5): 20 meq via ORAL
  Filled 2016-02-06 (×5): qty 1

## 2016-02-06 MED ORDER — ACETAMINOPHEN 325 MG PO TABS
650.0000 mg | ORAL_TABLET | Freq: Four times a day (QID) | ORAL | Status: DC | PRN
  Administered 2016-02-06: 650 mg via ORAL

## 2016-02-06 NOTE — Progress Notes (Signed)
Inpatient Diabetes Program Recommendations  AACE/ADA: New Consensus Statement on Inpatient Glycemic Control (2015)  Target Ranges:  Prepandial:   less than 140 mg/dL      [facility] postprandial:   less than 180 mg/dL (1-2 hours)      Critically ill patients:  140 - 180 mg/dL   Results for Dennis Haynes, Dennis Haynes (MRN [id number]) as of 02/06/2016 10:32  Ref. Range 02/06/2016 07:12 02/06/2016 08:35  Glucose-Capillary Latest Ref Range: 65 - 99 mg/dL 61 (L) 84    Admit with: Generalized weakness with difficulty ambulation and diarrhea  History: DM  Home DM Meds: Levemir 64 units QHS       Novolog 18 units tidwc       Novolog SSI       Metformin 1500 mg AM/ 1000 mg PM  Current Insulin Orders: Levemir 64 units QHS      Novolog Sensitive Correction Scale/ SSI (0-9 units) TID AC + HS      Novolog 18 units tidwc      MD- Please consider reducing Levemir to 50 units QHS (80% of total home dose)  Patient with Hypoglycemia this AM (CBG 61 mg/dl) after receiving 64 units Levemir last PM.     --Will follow patient during hospitalization--  Wyn Quaker RN, MSN, CDE Diabetes Coordinator Inpatient Glycemic Control Team Team Pager: [phone] (8a-5p)

## 2016-02-06 NOTE — NC FL2 (Signed)
[place] LEVEL OF CARE SCREENING TOOL     IDENTIFICATION  Patient Name: Dennis Haynes Birthdate: [birth year] Sex: male Admission Date (Current Location): 02/05/2016  [place] and [place] Number:  Engineering geologist and Address:  [facility], [address] [place], [place] [address]      Provider Number: [id number]  Attending Physician Name and Address:  Nicholes Mango, MD  Relative Name and Phone Number:       Current Level of Care: Hospital Recommended Level of Care: [place] Prior Approval Number:    Date Approved/Denied:   PASRR Number:  ([id number])  Discharge Plan: SNF    Current Diagnoses: Patient Active Problem List   Diagnosis Date Noted  . AKI (acute kidney injury) ([place]) 02/05/2016   . Anxiety   . Diabetes ([place])   . Hypertension      Orientation RESPIRATION BLADDER Height & Weight     Self, Time, Situation, Place  Normal Incontinent Weight: 178 lb (80.7 kg) Height:  5\' 10"  (177.8 cm)  BEHAVIORAL SYMPTOMS/MOOD NEUROLOGICAL BOWEL NUTRITION STATUS   (none )  (none) Continent Diet (Diet: Carb Modified )  AMBULATORY STATUS COMMUNICATION OF NEEDS Skin   Extensive Assist Verbally PU Stage and Appropriate Care (Pressure Ulcer Stage 3 on Right and Left Foot on toe. )                       Personal Care Assistance Level of Assistance  Bathing, Feeding, Dressing Bathing Assistance: Limited assistance Feeding assistance: Independent Dressing Assistance: Limited assistance     Functional Limitations Info  Sight, Hearing, Speech Sight Info: Adequate Hearing Info: Adequate Speech Info: Adequate    SPECIAL CARE FACTORS FREQUENCY  PT (By licensed PT), OT (By licensed OT)     PT Frequency:  (5) OT Frequency:  (5)            Contractures      Additional Factors Info  Code Status, Allergies, Psychotropic, Insulin Sliding Scale Code Status Info:  (Full Code. ) Allergies Info:  (No Known  Allergies. )   Insulin Sliding Scale Info:  (NovoLog Insulin )       Current Medications (02/06/2016):  This is the current hospital active medication list Current Facility-Administered Medications  Medication Dose Route Frequency Provider Last Rate Last Dose  . 0.9 %  sodium chloride infusion   Intravenous Continuous Nicholes Mango, MD 100 mL/hr at 02/05/16 2348    . oxyCODONE (Oxy IR/ROXICODONE) immediate release tablet 10 mg  10 mg Oral Q6H PRN Nicholes Mango, MD   10 mg at 02/06/16 0547   And  . acetaminophen (TYLENOL) tablet 325 mg  325 mg Oral Q6H PRN Nicholes Mango, MD      . ALPRAZolam Duanne Moron) tablet 0.5 mg  0.5 mg Oral TID PRN Nicholes Mango, MD   0.5 mg at 02/05/16 2358  . aspirin EC tablet 81 mg  81 mg Oral Daily Aruna Gouru, MD      . atorvastatin (LIPITOR) tablet 80 mg  80 mg Oral Daily Aruna Gouru, MD      . cefTRIAXone (ROCEPHIN) 1 g in dextrose 5 % 50 mL IVPB  1 g Intravenous Q24H Aruna Gouru, MD      . enoxaparin (LOVENOX) injection 40 mg  40 mg Subcutaneous QHS Nicholes Mango, MD   40 mg at 02/05/16 2359  . hydrochlorothiazide (HYDRODIURIL) tablet 25 mg  25 mg Oral Daily Nicholes Mango, MD      .  insulin aspart (novoLOG) injection 0-5 Units  0-5 Units Subcutaneous QHS Nicholes Mango, MD   2 Units at 02/05/16 2201  . insulin aspart (novoLOG) injection 0-9 Units  0-9 Units Subcutaneous TID WC Aruna Gouru, MD      . insulin aspart (novoLOG) injection 18 Units  18 Units Subcutaneous TID WC Aruna Gouru, MD      . insulin detemir (LEVEMIR) injection 64 Units  64 Units Subcutaneous QHS Nicholes Mango, MD   64 Units at 02/06/16 0040  . metoprolol (LOPRESSOR) tablet 50 mg  50 mg Oral BID Nicholes Mango, MD   50 mg at 02/05/16 2358  . mirtazapine (REMERON) tablet 30 mg  30 mg Oral QHS Nicholes Mango, MD   30 mg at 02/05/16 2358  . ondansetron (ZOFRAN) tablet 4 mg  4 mg Oral Q6H PRN Nicholes Mango, MD       Or  . ondansetron (ZOFRAN) injection 4 mg  4 mg Intravenous Q6H PRN Nicholes Mango, MD      . pantoprazole  (PROTONIX) EC tablet 40 mg  40 mg Oral QAC breakfast Aruna Gouru, MD      . pregabalin (LYRICA) capsule 300 mg  300 mg Oral BID Nicholes Mango, MD      . risperiDONE (RISPERDAL) tablet 0.25 mg  0.25 mg Oral BID Nicholes Mango, MD      . sertraline (ZOLOFT) tablet 100 mg  100 mg Oral Daily Aruna Gouru, MD      . traMADol (ULTRAM) tablet 50 mg  50 mg Oral Q6H PRN Nicholes Mango, MD      . zolpidem (AMBIEN) tablet 5 mg  5 mg Oral QHS PRN Nicholes Mango, MD   5 mg at 02/05/16 2358     Discharge Medications: Please see discharge summary for a list of discharge medications.  Relevant Imaging Results:  Relevant Lab Results:   Additional Information  (SSN: [id number])  Quintarius Ferns, Veronia Beets, LCSW

## 2016-02-06 NOTE — Progress Notes (Signed)
[facility] [place] NAME: Dennis Haynes    MR#:  [id number]  DATE OF BIRTH:  February 26, 1941  SUBJECTIVE:   Patient here due to generalized weakness and noted to have a urinary tract infection. Noted to be hypoglycemic this morning.  REVIEW OF SYSTEMS:    Review of Systems  Constitutional: Positive for malaise/fatigue. Negative for chills and fever.  HENT: Negative for congestion and tinnitus.   Eyes: Negative for blurred vision and double vision.  Respiratory: Negative for cough, shortness of breath and wheezing.   Cardiovascular: Negative for chest pain, orthopnea and PND.  Gastrointestinal: Negative for abdominal pain, diarrhea, nausea and vomiting.  Genitourinary: Negative for dysuria and hematuria.  Neurological: Positive for weakness. Negative for dizziness, sensory change and focal weakness.  All other systems reviewed and are negative.   Nutrition: Carb control Tolerating Diet: Yes Tolerating PT: Await Eval.    DRUG ALLERGIES:  No Known Allergies  VITALS:  Blood pressure (!) 157/85, pulse 87, temperature 98.5 F (36.9 C), temperature source Oral, resp. rate 18, height 5\' 10"  (1.778 m), weight 80.7 kg (178 lb), SpO2 99 %.  PHYSICAL EXAMINATION:   Physical Exam  GENERAL:  75 y.o.-year-old patient lying in the bed in no acute distress.  EYES: Pupils equal, round, reactive to light and accommodation. No scleral icterus. Extraocular muscles intact.  HEENT: Head atraumatic, normocephalic. Oropharynx and nasopharynx clear.  NECK:  Supple, no jugular venous distention. No thyroid enlargement, no tenderness.  LUNGS: Normal breath sounds bilaterally, no wheezing, rales, rhonchi. No use of accessory muscles of respiration.  CARDIOVASCULAR: S1, S2 normal. No murmurs, rubs, or gallops.  ABDOMEN: Soft, nontender, nondistended. Bowel sounds present. No organomegaly or mass.  EXTREMITIES: No cyanosis, clubbing or edema b/l.    NEUROLOGIC:  Cranial nerves II through XII are intact. No focal Motor or sensory deficits b/l. Globally weak. PSYCHIATRIC: The patient is alert and oriented x 3.  SKIN: No obvious rash, lesion, or ulcer.    LABORATORY PANEL:   CBC  Recent Labs Lab 02/06/16 0515  WBC 10.7*  HGB 13.3  HCT 40.4  PLT 328   ------------------------------------------------------------------------------------------------------------------  Chemistries   Recent Labs Lab 02/05/16 1336 02/06/16 0515  NA 138 141  K 3.2* 3.2*  CL 101 104  CO2 23 26  GLUCOSE 196* 72  BUN 27* 17  CREATININE 1.42* 0.96  CALCIUM 8.1* 7.8*  AST 28  --   ALT 20  --   ALKPHOS 152*  --   BILITOT 0.5  --    ------------------------------------------------------------------------------------------------------------------  Cardiac Enzymes  Recent Labs Lab 02/05/16 1336  TROPONINI <0.03   ------------------------------------------------------------------------------------------------------------------  RADIOLOGY:  Dg Chest 2 View  Result Date: 02/05/2016 CLINICAL DATA:  Weakness EXAM: CHEST  2 VIEW COMPARISON:  03/10/2012 thoracic spine radiographs FINDINGS: Atherosclerotic aortic arch.  Heart size within normal limits. Thoracic spondylosis. Right posterolateral sixth rib deformities suggesting old fracture. Old left healed rib fractures noted laterally. No pleural effusion. IMPRESSION: 1. Old bilateral healed rib fractures. Thoracic spondylosis. No acute findings. 2. Atherosclerotic calcification of the aortic arch. Electronically Signed   By: Van Clines M.D.   On: 02/05/2016 14:28     ASSESSMENT AND PLAN:   75 year old male with past medical history of essential hypertension Diabetes, anxiety who presented to the hospital due to generalized weakness and noted to have a urinary tract infection noted to be in acute kidney injury.  1. Acute kidney injury-secondary to dehydration and likely ATN. -  Hydrate with IV fluids  and his BUN and creatinine has improved.  2. Urinary tract infection-continue IV ceftriaxone, follow urine cultures.  3. Generalized weakness-multifactorial related to underlying UTI, deconditioning. -Continue IV antibiotics, IV fluids. Await physical therapy evaluation.  4. Diabetes with hypoglycemia-patient was noted to be hypoglycemic with blood sugars in the 60s this morning. -I will discontinue his scheduled NovoLog with meals, reduce his Levemir dose. Continue sliding scale insulin and follow blood sugars. - appreciate Diabetes Coordinator Input.   5. Hypokalemia-replace potassium supplements and repeat level in the morning. Check magnesium level.  6. Diabetic neuropathy-continue Dulera,.  7. GERD-continue Protonix.  8. Essential HTN - cont. Metoprolol, HCTZ.    All the records are reviewed and case discussed with Care Management/Social Worker. Management plans discussed with the patient, family and they are in agreement.  CODE STATUS: Full   DVT Prophylaxis: Lovenox  TOTAL TIME TAKING CARE OF THIS PATIENT: 30 minutes.   POSSIBLE D/C IN 2-3 DAYS, DEPENDING ON CLINICAL CONDITION.   Henreitta Leber M.D on 02/06/2016 at 1:23 PM  Between 7am to 6pm - Pager - [phone]  After 6pm go to [email or web address] - Proofreader  [facility] [place] [facility]  Office  [phone]  CC: Primary care physician; Laurell Josephs, MD

## 2016-02-07 DIAGNOSIS — L8990 Pressure ulcer of unspecified site, unspecified stage: Secondary | ICD-10-CM | POA: Insufficient documentation

## 2016-02-07 LAB — BASIC METABOLIC PANEL
ANION GAP: 11 (ref 5–15)
BUN: 10 mg/dL (ref 6–20)
CALCIUM: 7.6 mg/dL — AB (ref 8.9–10.3)
CO2: 23 mmol/L (ref 22–32)
CREATININE: 0.94 mg/dL (ref 0.61–1.24)
Chloride: 106 mmol/L (ref 101–111)
GFR calc Af Amer: >60 mL/min (ref >60)
GLUCOSE: 125 mg/dL — AB (ref 65–99)
Potassium: 3.2 mmol/L — ABNORMAL LOW (ref 3.5–5.1)
Sodium: 140 mmol/L (ref 135–145)

## 2016-02-07 LAB — GLUCOSE, CAPILLARY
Glucose-Capillary: 125 mg/dL — ABNORMAL HIGH (ref 65–99)
Glucose-Capillary: 189 mg/dL — ABNORMAL HIGH (ref 65–99)
Glucose-Capillary: 230 mg/dL — ABNORMAL HIGH (ref 65–99)
Glucose-Capillary: 277 mg/dL — ABNORMAL HIGH (ref 65–99)

## 2016-02-07 LAB — MAGNESIUM: Magnesium: 0.6 mg/dL — CL (ref 1.7–2.4)

## 2016-02-07 MED ORDER — MAGNESIUM SULFATE 2 GM/50ML IV SOLN
2.0000 g | Freq: Once | INTRAVENOUS | Status: DC
  Filled 2016-02-07: qty 50

## 2016-02-07 MED ORDER — MAGNESIUM OXIDE 400 (241.3 MG) MG PO TABS: 400.0000 mg | ORAL_TABLET | ORAL | Status: DC | PRN

## 2016-02-07 MED ORDER — MAGNESIUM SULFATE 4 GM/100ML IV SOLN
4.0000 g | Freq: Once | INTRAVENOUS | Status: DC
Start: 2016-02-07 — End: 2016-02-07
  Filled 2016-02-07: qty 100

## 2016-02-07 MED ORDER — MAGNESIUM SULFATE IN D5W 1-5 GM/100ML-% IV SOLN
1.0000 g | Freq: Once | INTRAVENOUS | Status: AC
  Administered 2016-02-07: 1 g via INTRAVENOUS
  Filled 2016-02-07: qty 100

## 2016-02-07 MED ORDER — MAGNESIUM SULFATE 4 GM/100ML IV SOLN
4.0000 g | Freq: Once | INTRAVENOUS | Status: AC
  Administered 2016-02-07: 4 g via INTRAVENOUS
  Filled 2016-02-07: qty 100

## 2016-02-07 NOTE — Evaluation (Signed)
Physical Therapy Evaluation Patient Details Name: Dennis Haynes MRN: [id number] DOB: [birth year] Today's Date: 02/07/2016   History of Present Illness  presented to ER secondary to generalized weakness, difficulty ambulating and persistent diarrhea; admitted with UTI and AKI secondary to dehydration.  Clinical Impression  Upon evaluation, patient alert and oriented to basic information; follows commands, but somewhat terse with therapist at times (agitated with questions from therapist, bed alarm, current health situation).  Patient generally weak and deconditioned, limited by pain in R knee and low back (both chronic for patient). Unable to complete any functional mobility without min assist +1 (chair follow for safety) from therapist; very poor balance, high fall risk noted.  Distance limited to 10' due to pain in R knee; patient unable to tolerate additional activity as result.  Unable to demonstrate ability to safely complete distances/activites required to facilitate return home at this time. Would benefit from skilled PT to address above deficits and promote optimal return to PLOF; recommend transition to STR upon discharge from acute hospitalization.     Follow Up Recommendations SNF    Equipment Recommendations  Rolling walker with 5" wheels    Recommendations for Other Services       Precautions / Restrictions Precautions Precautions: Fall Restrictions Weight Bearing Restrictions: No      Mobility  Bed Mobility Overal bed mobility: Needs Assistance Bed Mobility: Supine to Sit     Supine to sit: Min assist     General bed mobility comments: heavy use of bilat UEs and bedrails to achieve unsupported sitting  Transfers Overall transfer level: Needs assistance Equipment used: Rolling walker (2 wheeled) Transfers: Sit to/from Stand Sit to Stand: Min assist         General transfer comment: requires UE support to complete, min assist to prevent posterior LOB with movement  transition  Ambulation/Gait Ambulation/Gait assistance: Min assist Ambulation Distance (Feet): 10 Feet Assistive device: Rolling walker (2 wheeled)       General Gait Details: broad BOS with step to gait pattern, decreased stance time/weight shift to R LE.  Maintains R LE in mild knee flexion throughout gait cycle. Poor balance, poor activity tolerance. High fall risk. Unable to tolerate/attempt additional gait secondary to pain in R LE  Stairs            Wheelchair Mobility    Modified Rankin (Stroke Patients Only)       Balance Overall balance assessment: Needs assistance Sitting-balance support: No upper extremity supported;Feet supported Sitting balance-Leahy Scale: Good     Standing balance support: Bilateral upper extremity supported Standing balance-Leahy Scale: Poor                               Pertinent Vitals/Pain Pain Assessment: 0-10 Pain Score: 9  Pain Location: back Pain Descriptors / Indicators: Aching Pain Intervention(s): Limited activity within patient's tolerance;Monitored during session;Repositioned    Home Living Family/patient expects to be discharged to:: Private residence Living Arrangements: Alone Available Help at Discharge: Family;Available 24 hours/day Type of Home: House         Home Equipment: Walker - 4 wheels;Cane - single point Additional Comments: brother currently lives with patient, but patient reports brother planning to move out soon    Prior Function Level of Independence: Needs assistance         Comments: Ambulatory for limited household distances with 4WRW vs. SPC; endorses 5-6 falls in previous six months.  Patient not terribly  forth=coming with information, generally agitated with therapist questions.     Hand Dominance        Extremity/Trunk Assessment   Upper Extremity Assessment: Overall WFL for tasks assessed           Lower Extremity Assessment: Generalized weakness (grossly at  least 4/5.  Baseline neuropathy ankles distally; denies additional radicular symptoms or paresthesia)         Communication      Cognition Arousal/Alertness: Awake/alert Behavior During Therapy: Agitated Overall Cognitive Status: Within Functional Limits for tasks assessed                      General Comments      Exercises        Assessment/Plan    PT Assessment Patient needs continued PT services  PT Diagnosis Difficulty walking;Generalized weakness   PT Problem List Decreased strength;Decreased range of motion;Decreased activity tolerance;Decreased balance;Decreased mobility;Decreased safety awareness;Decreased knowledge of precautions;Decreased knowledge of use of DME;Pain  PT Treatment Interventions DME instruction;Gait training;Functional mobility training;Stair training;Therapeutic activities;Therapeutic exercise;Balance training;Patient/family education   PT Goals (Current goals can be found in the Care Plan section) Acute Rehab PT Goals Patient Stated Goal: to get back to doing things semi-independently again PT Goal Formulation: With patient Time For Goal Achievement: 02/21/16 Potential to Achieve Goals: Good    Frequency Min 2X/week   Barriers to discharge Decreased caregiver support      Co-evaluation               End of Session Equipment Utilized During Treatment: Gait belt Activity Tolerance: Patient limited by pain Patient left: with call bell/phone within reach;in chair;with chair alarm set Nurse Communication: Mobility status         Time: [id number] PT Time Calculation (min) (ACUTE ONLY): 22 min   Charges:   PT Evaluation $PT Eval Low Complexity: 1 Procedure     PT G Codes:        Dennis Haynes, PT, DPT, NCS 02/07/16, 1:41 PM [phone]

## 2016-02-07 NOTE — Progress Notes (Signed)
PT is recommending SNF. Clinical Education officer, museum (CSW) presented bed offers. [facility] chose [facility]. [facility] liaison is aware of accepted bed offer. CSW will continue to follow and assist as needed.   [employer], LCSW [phone]

## 2016-02-07 NOTE — Clinical Social Work Placement (Signed)
   CLINICAL SOCIAL WORK PLACEMENT  NOTE  Date:  02/07/2016  Patient Details  Name: Dennis Haynes MRN: [id number] Date of Birth: 01/03/1941  Clinical Social Work is seeking post-discharge placement for this patient at the [place] level of care (*CSW will initial, date and re-position this form in  chart as items are completed):  Yes   Patient/family provided with [place] Work Department's list of facilities offering this level of care within the geographic area requested by the patient (or if unable, by the patient's family).  Yes   Patient/family informed of their freedom to choose among providers that offer the needed level of care, that participate in Medicare, Medicaid or managed care program needed by the patient, have an available bed and are willing to accept the patient.  Yes   Patient/family informed of [facility]'s ownership interest in [facility] and [facility], as well as of the fact that they are under no obligation to receive care at these facilities.  PASRR submitted to EDS on 02/06/16     PASRR number received on 02/06/16     Existing PASRR number confirmed on       FL2 transmitted to all facilities in geographic area requested by pt/family on 02/06/16     FL2 transmitted to all facilities within larger geographic area on       Patient informed that his/her managed care company has contracts with or will negotiate with certain facilities, including the following:        Yes   Patient/family informed of bed offers received.  Patient chooses bed at  ([facility] )     Physician recommends and patient chooses bed at      Patient to be transferred to   on  .  Patient to be transferred to facility by       Patient family notified on   of transfer.  Name of family member notified:        PHYSICIAN       Additional Comment:    _______________________________________________ Gennette Shadix, Veronia Beets, LCSW 02/07/2016, 3:20 PM

## 2016-02-07 NOTE — Progress Notes (Signed)
[name] [place] NAME: Dennis Haynes    MR#:  [id number]  DATE OF BIRTH:  1941-04-01  SUBJECTIVE:   Patient here due to generalized weakness and noted to have a urinary tract infection. No further hypoglycemic episodes. Complaining of pain in his upper back.    REVIEW OF SYSTEMS:    Review of Systems  Constitutional: Positive for malaise/fatigue. Negative for chills and fever.  HENT: Negative for congestion and tinnitus.   Eyes: Negative for blurred vision and double vision.  Respiratory: Negative for cough, shortness of breath and wheezing.   Cardiovascular: Negative for chest pain, orthopnea and PND.  Gastrointestinal: Negative for abdominal pain, diarrhea, nausea and vomiting.  Genitourinary: Negative for dysuria and hematuria.  Musculoskeletal: Positive for back pain (upper back).  Neurological: Positive for weakness. Negative for dizziness, sensory change and focal weakness.  All other systems reviewed and are negative.   Nutrition: Carb control Tolerating Diet: Yes Tolerating PT: Await Eval.    DRUG ALLERGIES:  No Known Allergies  VITALS:  Blood pressure (!) 154/100, pulse 88, temperature 98.5 F (36.9 C), temperature source Oral, resp. rate 16, height 5\' 10"  (1.778 m), weight 80.7 kg (178 lb), SpO2 98 %.  PHYSICAL EXAMINATION:   Physical Exam  GENERAL:  75 y.o.-year-old patient lying in the bed in no acute distress.  EYES: Pupils equal, round, reactive to light and accommodation. No scleral icterus. Extraocular muscles intact.  HEENT: Head atraumatic, normocephalic. Oropharynx and nasopharynx clear.  NECK:  Supple, no jugular venous distention. No thyroid enlargement, no tenderness.  LUNGS: Normal breath sounds bilaterally, no wheezing, rales, rhonchi. No use of accessory muscles of respiration.  CARDIOVASCULAR: S1, S2 normal. No murmurs, rubs, or gallops.  ABDOMEN: Soft, nontender, nondistended. Bowel sounds present. No  organomegaly or mass.  EXTREMITIES: No cyanosis, clubbing or edema b/l.    NEUROLOGIC: Cranial nerves II through XII are intact. No focal Motor or sensory deficits b/l. Globally weak. PSYCHIATRIC: The patient is alert and oriented x 3.  SKIN: No obvious rash, lesion, or ulcer.    LABORATORY PANEL:   CBC  Recent Labs Lab 02/06/16 0515  WBC 10.7*  HGB 13.3  HCT 40.4  PLT 328   ------------------------------------------------------------------------------------------------------------------  Chemistries   Recent Labs Lab 02/05/16 1336  02/07/16 0455  NA 138  < > 140  K 3.2*  < > 3.2*  CL 101  < > 106  CO2 23  < > 23  GLUCOSE 196*  < > 125*  BUN 27*  < > 10  CREATININE 1.42*  < > 0.94  CALCIUM 8.1*  < > 7.6*  MG  --   --  0.6*  AST 28  --   --   ALT 20  --   --   ALKPHOS 152*  --   --   BILITOT 0.5  --   --   < > = values in this interval not displayed. ------------------------------------------------------------------------------------------------------------------  Cardiac Enzymes  Recent Labs Lab 02/05/16 1336  TROPONINI <0.03   ------------------------------------------------------------------------------------------------------------------  RADIOLOGY:  Dg Chest 2 View  Result Date: 02/05/2016 CLINICAL DATA:  Weakness EXAM: CHEST  2 VIEW COMPARISON:  03/10/2012 thoracic spine radiographs FINDINGS: Atherosclerotic aortic arch.  Heart size within normal limits. Thoracic spondylosis. Right posterolateral sixth rib deformities suggesting old fracture. Old left healed rib fractures noted laterally. No pleural effusion. IMPRESSION: 1. Old bilateral healed rib fractures. Thoracic spondylosis. No acute findings. 2. Atherosclerotic calcification of the aortic  arch. Electronically Signed   By: Van Clines M.D.   On: 02/05/2016 14:28     ASSESSMENT AND PLAN:   75 year old male with past medical history of essential hypertension Diabetes, anxiety who presented  to the hospital due to generalized weakness and noted to have a urinary tract infection noted to be in acute kidney injury.  1. Acute kidney injury-secondary to dehydration and likely ATN. - improved and resolved w/ IV fluids.    2. Urinary tract infection-continue IV ceftriaxone - urine cultures still pending.    3. Generalized weakness-multifactorial related to underlying UTI, deconditioning. -Continue IV antibiotics, IV fluids.  - await PT eval.   4. Diabetes with hypoglycemia-no further episodes of hypoglycemia yesterday. -Continue his reduced dose Levemir. Continue sliding scale insulin and follow blood sugars. - appreciate Diabetes Coordinator Input.   5. Hypokalemia/Hypomagensemia-cont. To replace both and repeat level in a.m   6. Diabetic neuropathy-continue Dulera,.  7. GERD-continue Protonix.  8. Essential HTN - cont. Metoprolol, HCTZ.    9. Chronic Back pain - cont. Oxycodone, Tramadol.  - needs pain management as outpatient.   All the records are reviewed and case discussed with Care Management/Social Worker. Management plans discussed with the patient, family and they are in agreement.  CODE STATUS: Full   DVT Prophylaxis: Lovenox  TOTAL TIME TAKING CARE OF THIS PATIENT: 30 minutes.   POSSIBLE D/C IN 1-2 DAYS, DEPENDING ON CLINICAL CONDITION.   Henreitta Leber M.D on 02/07/2016 at 1:00 PM  Between 7am to 6pm - Pager - [phone]  After 6pm go to [email or web address] - Proofreader  [facility]  Office  [phone]  CC: Primary care physician; Laurell Josephs, MD

## 2016-02-07 NOTE — Progress Notes (Signed)
Patient is alert, but confused. Stories have changed many times throughout the day. PRN pain meds given with some relief, yet patient was able to sleep on and off most of the day.  Up with assist x1 and WW, tolerated up to the chair for a short period of time. Blood sugars 125, 189, 230, received SS coverage as ordered. LBM, this shift. Uses urinal most of the time. VSS. Bed alarm on for safety.

## 2016-02-08 ENCOUNTER — Encounter: Payer: Self-pay | Admitting: Specialist

## 2016-02-08 DIAGNOSIS — E119 Type 2 diabetes mellitus without complications: Secondary | ICD-10-CM

## 2016-02-08 LAB — BASIC METABOLIC PANEL
ANION GAP: 9 (ref 5–15)
BUN: 11 mg/dL (ref 6–20)
CHLORIDE: 105 mmol/L (ref 101–111)
CO2: 24 mmol/L (ref 22–32)
Calcium: 7.7 mg/dL — ABNORMAL LOW (ref 8.9–10.3)
Creatinine, Ser: 0.92 mg/dL (ref 0.61–1.24)
GFR calc Af Amer: >60 mL/min (ref >60)
GFR calc non Af Amer: >60 mL/min (ref >60)
Glucose, Bld: 134 mg/dL — ABNORMAL HIGH (ref 65–99)
POTASSIUM: 3.3 mmol/L — AB (ref 3.5–5.1)
SODIUM: 138 mmol/L (ref 135–145)

## 2016-02-08 LAB — GLUCOSE, CAPILLARY: Glucose-Capillary: 135 mg/dL — ABNORMAL HIGH (ref 65–99)

## 2016-02-08 LAB — URINE CULTURE: Culture: 80000 — AB

## 2016-02-08 LAB — MAGNESIUM: MAGNESIUM: 1.5 mg/dL — AB (ref 1.7–2.4)

## 2016-02-08 MED ORDER — OXYCODONE-ACETAMINOPHEN 10-325 MG PO TABS: 1.0000 | ORAL_TABLET | Freq: Four times a day (QID) | ORAL | 0 refills | Status: DC | PRN

## 2016-02-08 MED ORDER — AMOXICILLIN 500 MG PO CAPS: 500.0000 mg | ORAL_CAPSULE | Freq: Three times a day (TID) | ORAL | 0 refills | Status: AC

## 2016-02-08 MED ORDER — INSULIN ASPART 100 UNIT/ML FLEXPEN: 10.0000 [IU] | PEN_INJECTOR | Freq: Three times a day (TID) | SUBCUTANEOUS | 11 refills | Status: DC

## 2016-02-08 MED ORDER — TRAMADOL HCL 50 MG PO TABS: 50.0000 mg | ORAL_TABLET | Freq: Four times a day (QID) | ORAL | Status: DC | PRN

## 2016-02-08 MED ORDER — INSULIN DETEMIR 100 UNIT/ML FLEXPEN: 50.0000 [IU] | PEN_INJECTOR | Freq: Every day | SUBCUTANEOUS | 11 refills | Status: DC

## 2016-02-08 MED ORDER — ALPRAZOLAM 0.5 MG PO TABS: 0.5000 mg | ORAL_TABLET | Freq: Three times a day (TID) | ORAL | 0 refills | Status: DC | PRN

## 2016-02-08 MED ORDER — TRAMADOL HCL 50 MG PO TABS: 50.0000 mg | ORAL_TABLET | Freq: Four times a day (QID) | ORAL | 0 refills | Status: DC | PRN

## 2016-02-08 NOTE — Progress Notes (Signed)
Report called to Kim at [facility]. EMS called for transport 

## 2016-02-08 NOTE — Progress Notes (Signed)
Inpatient Diabetes Program Recommendations  AACE/ADA: New Consensus Statement on Inpatient Glycemic Control (2015)  Target Ranges:  Prepandial:   less than 140 mg/dL      [facility] postprandial:   less than 180 mg/dL (1-2 hours)      Critically ill patients:  140 - 180 mg/dL   Lab Results  Component Value Date   GLUCAP 135 (H) 02/08/2016   HGBA1C 9.8 (H) 02/06/2016   Results for AARAF, PLAZA (MRN [id number]) as of 02/08/2016 11:10  Ref. Range 02/07/2016 07:50 02/07/2016 11:23 02/07/2016 17:53 02/07/2016 21:42 02/08/2016 07:32  Glucose-Capillary Latest Ref Range: 65 - 99 mg/dL 125 (H) 189 (H) 230 (H) 277 (H) 135 (H)     Admit with: Generalized weakness with difficulty ambulation and diarrhea    Home DM Meds: Levemir 64 units QHS                                                 Novolog 18 units tidwc                                                 Novolog SSI                                                 Metformin 1500 mg AM/ 1000 mg PM  Current Insulin Orders: Levemir 32 units QHS                                       Novolog Sensitive Correction Scale/ SSI (0-9 units) TID AC + HS                                       No meal coverage  Please consider adding Novolog 5 units tid with meals, continue Novolog correction as ordered.   Gentry Fitz, RN, BA, MHA, CDE Diabetes Coordinator Inpatient Diabetes Program  [phone] (Team Pager) [phone] ([place]) 02/08/2016 11:12 AM

## 2016-02-08 NOTE — NC FL2 (Signed)
[place] LEVEL OF CARE SCREENING TOOL     IDENTIFICATION  Patient Name: Dennis Haynes Birthdate: [birth year] Sex: male Admission Date (Current Location): 02/05/2016  [place] and [place] Number:  Engineering geologist and Address:  [facility], [address], [place], [place] [address]      Provider Number: [id number]  Attending Physician Name and Address:  Henreitta Leber, MD  Relative Name and Phone Number:       Current Level of Care: Hospital Recommended Level of Care: [place] Prior Approval Number:    Date Approved/Denied:   PASRR Number:  ([id number])  Discharge Plan: SNF    Current Diagnoses: Patient Active Problem List   Diagnosis Date Noted  . Pressure ulcer 02/07/2016  . AKI (acute kidney injury) ([place]) 02/05/2016    Orientation RESPIRATION BLADDER Height & Weight     Self, Time, Situation, Place  Normal Incontinent Weight: 178 lb (80.7 kg) Height:  5\' 10"  (177.8 cm)  BEHAVIORAL SYMPTOMS/MOOD NEUROLOGICAL BOWEL NUTRITION STATUS   (none )  (none) Continent Diet (Diet: Carb Modified )  AMBULATORY STATUS COMMUNICATION OF NEEDS Skin   Extensive Assist Verbally PU Stage and Appropriate Care (Pressure Ulcer Stage 3 on Right and Left Foot on toe. )                       Personal Care Assistance Level of Assistance  Bathing, Feeding, Dressing Bathing Assistance: Limited assistance Feeding assistance: Independent Dressing Assistance: Limited assistance     Functional Limitations Info  Sight, Hearing, Speech Sight Info: Adequate Hearing Info: Adequate Speech Info: Adequate    SPECIAL CARE FACTORS FREQUENCY  PT (By licensed PT), OT (By licensed OT)     PT Frequency:  (5) OT Frequency:  (5)            Contractures      Additional Factors Info  Code Status, Allergies, Psychotropic, Insulin Sliding Scale Code Status Info:  (Full Code. ) Allergies Info:  (No Known Allergies. )   Insulin  Sliding Scale Info:  (NovoLog Insulin )       Current Medications (02/08/2016):  This is the current hospital active medication list Current Facility-Administered Medications  Medication Dose Route Frequency Provider Last Rate Last Dose  . oxyCODONE (Oxy IR/ROXICODONE) immediate release tablet 10 mg  10 mg Oral Q6H PRN Henreitta Leber, MD   10 mg at 02/08/16 0401   And  . acetaminophen (TYLENOL) tablet 325 mg  325 mg Oral Q6H PRN Henreitta Leber, MD      . acetaminophen (TYLENOL) tablet 650 mg  650 mg Oral Q6H PRN Henreitta Leber, MD   650 mg at 02/06/16 1422  . ALPRAZolam Duanne Moron) tablet 0.5 mg  0.5 mg Oral TID PRN Nicholes Mango, MD   0.5 mg at 02/07/16 2152  . aspirin EC tablet 81 mg  81 mg Oral Daily Nicholes Mango, MD   81 mg at 02/08/16 0825  . atorvastatin (LIPITOR) tablet 80 mg  80 mg Oral Daily Nicholes Mango, MD   80 mg at 02/07/16 1014  . cefTRIAXone (ROCEPHIN) 1 g in dextrose 5 % 50 mL IVPB  1 g Intravenous Q24H Nicholes Mango, MD   1 g at 02/07/16 2152  . enoxaparin (LOVENOX) injection 40 mg  40 mg Subcutaneous QHS Nicholes Mango, MD   40 mg at 02/07/16 2152  . hydrochlorothiazide (HYDRODIURIL) tablet 25 mg  25 mg Oral Daily Aruna Gouru,  MD   25 mg at 02/07/16 1014  . insulin aspart (novoLOG) injection 0-5 Units  0-5 Units Subcutaneous QHS Nicholes Mango, MD   3 Units at 02/07/16 2206  . insulin aspart (novoLOG) injection 0-9 Units  0-9 Units Subcutaneous TID WC Nicholes Mango, MD   1 Units at 02/08/16 0824  . insulin detemir (LEVEMIR) injection 32 Units  32 Units Subcutaneous QHS Henreitta Leber, MD   32 Units at 02/07/16 2207  . magnesium oxide (MAG-OX) tablet 400 mg  400 mg Oral Q4H PRN Harrie Foreman, MD      . metoprolol (LOPRESSOR) tablet 50 mg  50 mg Oral BID Nicholes Mango, MD   50 mg at 02/07/16 2153  . mirtazapine (REMERON) tablet 30 mg  30 mg Oral QHS Nicholes Mango, MD   30 mg at 02/07/16 2153  . ondansetron (ZOFRAN) tablet 4 mg  4 mg Oral Q6H PRN Nicholes Mango, MD       Or  . ondansetron  (ZOFRAN) injection 4 mg  4 mg Intravenous Q6H PRN Aruna Gouru, MD      . pantoprazole (PROTONIX) EC tablet 40 mg  40 mg Oral QAC breakfast Nicholes Mango, MD   40 mg at 02/08/16 0825  . potassium chloride SA (K-DUR,KLOR-CON) CR tablet 20 mEq  20 mEq Oral BID Henreitta Leber, MD   20 mEq at 02/07/16 2153  . pregabalin (LYRICA) capsule 300 mg  300 mg Oral BID Nicholes Mango, MD   300 mg at 02/07/16 2152  . risperiDONE (RISPERDAL) tablet 0.25 mg  0.25 mg Oral BID Nicholes Mango, MD   0.25 mg at 02/07/16 2153  . sertraline (ZOLOFT) tablet 100 mg  100 mg Oral Daily Nicholes Mango, MD   100 mg at 02/07/16 1014  . traMADol (ULTRAM) tablet 50 mg  50 mg Oral Q6H PRN Nicholes Mango, MD   50 mg at 02/08/16 0834  . zolpidem (AMBIEN) tablet 5 mg  5 mg Oral QHS PRN Nicholes Mango, MD   5 mg at 02/05/16 2358     Discharge Medications: Please see discharge summary for a list of discharge medications.  Relevant Imaging Results:  Relevant Lab Results:   Additional Information  (SSN: [id number])  Jasnoor Trussell, Veronia Beets, LCSW

## 2016-02-08 NOTE — Care Management Note (Signed)
Case Management Note  Patient Details  Name: Dennis Haynes MRN: [id number] Date of Birth: [birth year]  Subjective/Objective:                    Action/Plan: [facility] discharge to [facility] to SNF today. CSW following for placement.   Expected Discharge Date:  02/08/16               Expected Discharge Plan:  Skilled Nursing Facility  In-House Referral:  Clinical Social Work  Discharge planning Services  CM Consult  Post Acute Care Choice:    Choice offered to:     DME Arranged:    DME Agency:     HH Arranged:    [place] Agency:     Status of Service:  Completed, signed off  If discussed at [employer], dates discussed:    Additional Comments:  Alvie Heidelberg, RN 02/08/2016, 9:24 AM

## 2016-02-08 NOTE — Care Management Important Message (Signed)
Important Message  Patient Details  Name: Dennis Haynes MRN: [id number] Date of Birth: 11-Aug-1940   Medicare Important Message Given:  N/A - LOS <3 / Initial given by admissions    Alvie Heidelberg, RN 02/08/2016, 12:08 PM

## 2016-02-08 NOTE — Progress Notes (Signed)
Patient is medically stable for D/C to [facility] today. Per [facility] liaision patient will go to room 707. RN will call report to Yaakov Guthrie at [phone] and arrange EMS for transport. Clinical Education officer, museum (CSW) sent D/C orders to [facility] via [employer]. Patient is aware of above. CSW contacted patient's sons Antony Haste and Dub Mikes and made them aware of above. Please reconsult if future social work needs arise. CSW signing off.   [employer], LCSW [phone]

## 2016-02-08 NOTE — Progress Notes (Signed)
EMS her for transport

## 2016-02-08 NOTE — Clinical Social Work Placement (Signed)
   CLINICAL SOCIAL WORK PLACEMENT  NOTE  Date:  02/08/2016  Patient Details  Name: Dennis Haynes MRN: [id number] Date of Birth: 1941/04/21  Clinical Social Work is seeking post-discharge placement for this patient at the [place] level of care (*CSW will initial, date and re-position this form in  chart as items are completed):  Yes   Patient/family provided with [place] Work Department's list of facilities offering this level of care within the geographic area requested by the patient (or if unable, by the patient's family).  Yes   Patient/family informed of their freedom to choose among providers that offer the needed level of care, that participate in Medicare, Medicaid or managed care program needed by the patient, have an available bed and are willing to accept the patient.  Yes   Patient/family informed of [facility]'s ownership interest in [facility] and [facility], as well as of the fact that they are under no obligation to receive care at these facilities.  PASRR submitted to EDS on 02/06/16     PASRR number received on 02/06/16     Existing PASRR number confirmed on       FL2 transmitted to all facilities in geographic area requested by pt/family on 02/06/16     FL2 transmitted to all facilities within larger geographic area on       Patient informed that his/her managed care company has contracts with or will negotiate with certain facilities, including the following:        Yes   Patient/family informed of bed offers received.  Patient chooses bed at  ([facility] )     Physician recommends and patient chooses bed at      Patient to be transferred to  ([facility] ) on 02/08/16.  Patient to be transferred to facility by  [facility] )     Patient family notified on 02/08/16 of transfer.  Name of family member notified:   (Patient's sons Antony Haste and Dub Mikes are aware of D/C today. )     PHYSICIAN       Additional Comment:     _______________________________________________ Alif Petrak, Veronia Beets, LCSW 02/08/2016, 9:46 AM

## 2016-02-08 NOTE — Discharge Summary (Signed)
[facility] [place] NAME: Dennis Haynes    MR#:  [id number]  DATE OF BIRTH:  06-Dec-1940  DATE OF ADMISSION:  02/05/2016 ADMITTING PHYSICIAN: Nicholes Mango, MD  DATE OF DISCHARGE: 02/08/2016  PRIMARY CARE PHYSICIAN: SPENCER,DONALD, MD    ADMISSION DIAGNOSIS:  Dehydration [E86.0] UTI (lower urinary tract infection) [N39.0] Generalized weakness [R53.1]  DISCHARGE DIAGNOSIS:  Active Problems:   AKI (acute kidney injury) ([place])   Pressure ulcer   Diabetes ([place])   SECONDARY DIAGNOSIS:   Past Medical History:  Diagnosis Date  . Anxiety   . Chronic back pain   . Dementia   . Diabetes ([place])   . Hypertension     HOSPITAL COURSE:   75 year old male with past medical history of essential hypertension Diabetes, anxiety who presented to the hospital due to generalized weakness and noted to have a urinary tract infection noted to be in acute kidney injury.  1. Acute kidney injury-secondary to dehydration and likely ATN. - pt. Received IV fluids and Cr. Has normalized now.   2. Urinary tract infection- Urine culture + for Enterococcus which is pansensitive.  - while in the hospital pt. Was on IV Ceftriaxone and now being discharged on Oral amoxicillin  3. Generalized weakness-multifactorial related to underlying UTI, deconditioning. - slightly improved w/ treatment. Seen by PT and they recommend SNF/STR and pt. Is being discharged there.   4. Diabetes with hypoglycemia- pt.'s diabetic regimen has been adjusted.  No further episodes of hypoglycemia since admission.  - pt. Is being discharged on lower dose of Levemir, Novolog w/ meals, and his metformin.   5. Hypokalemia/Hypomagensemia- improved and resolved w/ supplementation and can be further followed as outpatient.   6. Diabetic neuropathy- pt. Will cont. His Lyrica  7. GERD- he will continue his Omeprazole.  8. Essential HTN - he will cont. Metoprolol, HCTZ.    9. Chronic Back  pain - he will cont. Oxycodone, Tramadol.  - He needs pain management eval as outpatient.   DISCHARGE CONDITIONS:   Stable  CONSULTS OBTAINED:    DRUG ALLERGIES:  No Known Allergies  DISCHARGE MEDICATIONS:     Medication List    TAKE these medications   ALPRAZolam 0.5 MG tablet Commonly known as:  XANAX Take 1 tablet (0.5 mg total) by mouth 3 (three) times daily as needed for anxiety.   amoxicillin 500 MG capsule Commonly known as:  AMOXIL Take 1 capsule (500 mg total) by mouth 3 (three) times daily.   aspirin EC 81 MG tablet Take 1 tablet by mouth daily.   atorvastatin 80 MG tablet Commonly known as:  LIPITOR Take 80 mg by mouth daily.   hydrochlorothiazide 25 MG tablet Commonly known as:  HYDRODIURIL Take 25 mg by mouth daily.   insulin aspart 100 UNIT/ML FlexPen Commonly known as:  NOVOLOG Inject 10 Units into the skin 3 (three) times daily with meals. Plus up to an additional 20 units 3 times daily according to sliding scale. What changed:  how much to take   Insulin Detemir 100 UNIT/ML Pen Commonly known as:  LEVEMIR FLEXPEN Inject 50 Units into the skin at bedtime. What changed:  how much to take   metFORMIN 1000 MG tablet Commonly known as:  GLUCOPHAGE Take 1,000-1,500 mg by mouth 2 (two) times daily with a meal. Take 1.5 tabs every morning and 1 tab every evening.   metoprolol 50 MG tablet Commonly known as:  LOPRESSOR Take 50 mg by mouth 2 (  two) times daily.   mirtazapine 30 MG tablet Commonly known as:  REMERON Take 30 mg by mouth at bedtime.   omeprazole 40 MG capsule Commonly known as:  PRILOSEC Take 40 mg by mouth daily.   oxyCODONE-acetaminophen 10-325 MG tablet Commonly known as:  PERCOCET Take 1 tablet by mouth every 6 (six) hours as needed for pain.   pregabalin 150 MG capsule Commonly known as:  LYRICA Take 300 mg by mouth 2 (two) times daily.   risperiDONE 0.25 MG tablet Commonly known as:  RISPERDAL Take 0.25 mg by mouth 2  (two) times daily.   sertraline 100 MG tablet Commonly known as:  ZOLOFT Take 100 mg by mouth daily.   traMADol 50 MG tablet Commonly known as:  ULTRAM Take 1-2 tablets (50-100 mg total) by mouth every 6 (six) hours as needed for moderate pain. What changed:  how much to take   zolpidem 5 MG tablet Commonly known as:  AMBIEN Take 5 mg by mouth at bedtime as needed for sleep.         DISCHARGE INSTRUCTIONS:   DIET:  Cardiac diet and Diabetic diet  DISCHARGE CONDITION:  Stable  ACTIVITY:  Activity as tolerated  OXYGEN:  Home Oxygen: No.   Oxygen Delivery: room air  DISCHARGE LOCATION:  nursing home   If you experience worsening of your admission symptoms, develop shortness of breath, life threatening emergency, suicidal or homicidal thoughts you must seek medical attention immediately by calling 911 or calling your MD immediately  if symptoms less severe.  You Must read complete instructions/literature along with all the possible adverse reactions/side effects for all the Medicines you take and that have been prescribed to you. Take any new Medicines after you have completely understood and accpet all the possible adverse reactions/side effects.   Please note  You were cared for by a hospitalist during your hospital stay. If you have any questions about your discharge medications or the care you received while you were in the hospital after you are discharged, you can call the unit and asked to speak with the hospitalist on call if the hospitalist that took care of you is not available. Once you are discharged, your primary care physician will handle any further medical issues. Please note that NO REFILLS for any discharge medications will be authorized once you are discharged, as it is imperative that you return to your primary care physician (or establish a relationship with a primary care physician if you do not have one) for your aftercare needs so that they can  reassess your need for medications and monitor your lab values.     Today   Still complaining of back pain which is chronic for him.  No fever, abdominal pain, N/V. Renal function normalized.   VITAL SIGNS:  Blood pressure 129/65, pulse 81, temperature 98.1 F (36.7 C), temperature source Oral, resp. rate 18, height 5\' 10"  (1.778 m), weight 80.7 kg (178 lb), SpO2 96 %.  I/O:    Intake/Output Summary (Last 24 hours) at 02/08/16 0913 Last data filed at 02/08/16 [id number]  Gross per 24 hour  Intake                0 ml  Output              500 ml  Net             -500 ml    PHYSICAL EXAMINATION:  GENERAL:  75 y.o.-year-old patient lying in the  bed with no acute distress.  EYES: Pupils equal, round, reactive to light and accommodation. No scleral icterus. Extraocular muscles intact.  HEENT: Head atraumatic, normocephalic. Oropharynx and nasopharynx clear.  NECK:  Supple, no jugular venous distention. No thyroid enlargement, no tenderness.  LUNGS: Normal breath sounds bilaterally, no wheezing, rales,rhonchi. No use of accessory muscles of respiration.  CARDIOVASCULAR: S1, S2 normal. No murmurs, rubs, or gallops.  ABDOMEN: Soft, non-tender, non-distended. Bowel sounds present. No organomegaly or mass.  EXTREMITIES: No pedal edema, cyanosis, or clubbing.  NEUROLOGIC: Cranial nerves II through XII are intact. No focal motor or sensory defecits b/l.  PSYCHIATRIC: The patient is alert and oriented x 3. Good affect.  SKIN: No obvious rash, lesion, or ulcer.   DATA REVIEW:   CBC  Recent Labs Lab 02/06/16 0515  WBC 10.7*  HGB 13.3  HCT 40.4  PLT 328    Chemistries   Recent Labs Lab 02/05/16 1336  02/08/16 0554  NA 138  < > 138  K 3.2*  < > 3.3*  CL 101  < > 105  CO2 23  < > 24  GLUCOSE 196*  < > 134*  BUN 27*  < > 11  CREATININE 1.42*  < > 0.92  CALCIUM 8.1*  < > 7.7*  MG  --   < > 1.5*  AST 28  --   --   ALT 20  --   --   ALKPHOS 152*  --   --   BILITOT 0.5  --   --    < > = values in this interval not displayed.  Cardiac Enzymes  Recent Labs Lab 02/05/16 1336  TROPONINI <0.03    Microbiology Results  Results for orders placed or performed during the hospital encounter of 02/05/16  Urine culture     Status: Abnormal (Preliminary result)   Collection Time: 02/05/16  5:25 PM  Result Value Ref Range Status   Specimen Description URINE, RANDOM  Final   Special Requests NONE  Final   Culture 80,000 COLONIES/mL ENTEROCOCCUS FAECALIS (A)  Final   Report Status PENDING  Incomplete   Organism ID, Bacteria ENTEROCOCCUS FAECALIS (A)  Final      Susceptibility   Enterococcus faecalis - MIC*    AMPICILLIN <=2 SENSITIVE Sensitive     LEVOFLOXACIN 1 SENSITIVE Sensitive     NITROFURANTOIN <=16 SENSITIVE Sensitive     VANCOMYCIN 2 SENSITIVE Sensitive     * 80,000 COLONIES/mL ENTEROCOCCUS FAECALIS    RADIOLOGY:  No results found.    Management plans discussed with the patient, family and they are in agreement.  CODE STATUS:     Code Status Orders        Start     Ordered   02/05/16 2333  Full code  Continuous     02/05/16 2332    Code Status History    Date Active Date Inactive Code Status Order ID Comments User Context   This patient has a current code status but no historical code status.      TOTAL TIME TAKING CARE OF THIS PATIENT: 40 minutes.    Henreitta Leber M.D on 02/08/2016 at 9:13 AM  Between 7am to 6pm - Pager - [phone]  After 6pm go to [email or web address] - Proofreader  [facility]  Office  [phone]  CC: Primary care physician; Laurell Josephs, MD

## 2016-02-27 ENCOUNTER — Emergency Department: Payer: Medicare Other

## 2016-02-27 ENCOUNTER — Emergency Department
Admission: EM | Admit: 2016-02-27 | Discharge: 2016-02-27 | Disposition: A | Discharge status: Other Acute Inpatient Hospital | Payer: Medicare Other | Attending: Emergency Medicine | Admitting: Emergency Medicine

## 2016-02-27 ENCOUNTER — Inpatient Hospital Stay (HOSPITAL_COMMUNITY)
Admission: AD | Admit: 2016-02-27 | Discharge: 2016-03-05 | DRG: 095 | Disposition: A | Discharge status: Skilled Nursing Facility | Payer: Medicare Other | Source: Other Acute Inpatient Hospital | Attending: Internal Medicine | Admitting: Internal Medicine

## 2016-02-27 ENCOUNTER — Encounter: Payer: Self-pay | Admitting: Emergency Medicine

## 2016-02-27 DIAGNOSIS — Z7982 Long term (current) use of aspirin: Secondary | ICD-10-CM | POA: Insufficient documentation

## 2016-02-27 DIAGNOSIS — E785 Hyperlipidemia, unspecified: Secondary | ICD-10-CM | POA: Diagnosis not present

## 2016-02-27 DIAGNOSIS — E876 Hypokalemia: Secondary | ICD-10-CM | POA: Diagnosis present

## 2016-02-27 DIAGNOSIS — S22089A Unspecified fracture of T11-T12 vertebra, initial encounter for closed fracture: Secondary | ICD-10-CM | POA: Diagnosis present

## 2016-02-27 DIAGNOSIS — E119 Type 2 diabetes mellitus without complications: Secondary | ICD-10-CM

## 2016-02-27 DIAGNOSIS — Y999 Unspecified external cause status: Secondary | ICD-10-CM | POA: Diagnosis not present

## 2016-02-27 DIAGNOSIS — N529 Male erectile dysfunction, unspecified: Secondary | ICD-10-CM | POA: Diagnosis present

## 2016-02-27 DIAGNOSIS — M4644 Discitis, unspecified, thoracic region: Secondary | ICD-10-CM | POA: Diagnosis present

## 2016-02-27 DIAGNOSIS — Z7984 Long term (current) use of oral hypoglycemic drugs: Secondary | ICD-10-CM | POA: Diagnosis not present

## 2016-02-27 DIAGNOSIS — Y9389 Activity, other specified: Secondary | ICD-10-CM | POA: Diagnosis not present

## 2016-02-27 DIAGNOSIS — N390 Urinary tract infection, site not specified: Secondary | ICD-10-CM | POA: Diagnosis not present

## 2016-02-27 DIAGNOSIS — I10 Essential (primary) hypertension: Secondary | ICD-10-CM | POA: Diagnosis present

## 2016-02-27 DIAGNOSIS — K5900 Constipation, unspecified: Secondary | ICD-10-CM | POA: Insufficient documentation

## 2016-02-27 DIAGNOSIS — E860 Dehydration: Secondary | ICD-10-CM | POA: Diagnosis not present

## 2016-02-27 DIAGNOSIS — Z794 Long term (current) use of insulin: Secondary | ICD-10-CM | POA: Diagnosis not present

## 2016-02-27 DIAGNOSIS — F419 Anxiety disorder, unspecified: Secondary | ICD-10-CM | POA: Diagnosis not present

## 2016-02-27 DIAGNOSIS — C679 Malignant neoplasm of bladder, unspecified: Secondary | ICD-10-CM | POA: Diagnosis not present

## 2016-02-27 DIAGNOSIS — Z9689 Presence of other specified functional implants: Secondary | ICD-10-CM | POA: Diagnosis not present

## 2016-02-27 DIAGNOSIS — B952 Enterococcus as the cause of diseases classified elsewhere: Secondary | ICD-10-CM | POA: Diagnosis present

## 2016-02-27 DIAGNOSIS — E538 Deficiency of other specified B group vitamins: Secondary | ICD-10-CM | POA: Diagnosis not present

## 2016-02-27 DIAGNOSIS — S299XXA Unspecified injury of thorax, initial encounter: Secondary | ICD-10-CM | POA: Diagnosis present

## 2016-02-27 DIAGNOSIS — Z6828 Body mass index (BMI) 28.0-28.9, adult: Secondary | ICD-10-CM

## 2016-02-27 DIAGNOSIS — N12 Tubulo-interstitial nephritis, not specified as acute or chronic: Secondary | ICD-10-CM | POA: Diagnosis present

## 2016-02-27 DIAGNOSIS — R296 Repeated falls: Secondary | ICD-10-CM

## 2016-02-27 DIAGNOSIS — M4654 Other infective spondylopathies, thoracic region: Secondary | ICD-10-CM | POA: Diagnosis not present

## 2016-02-27 DIAGNOSIS — E11649 Type 2 diabetes mellitus with hypoglycemia without coma: Secondary | ICD-10-CM | POA: Diagnosis not present

## 2016-02-27 DIAGNOSIS — S22088A Other fracture of T11-T12 vertebra, initial encounter for closed fracture: Secondary | ICD-10-CM | POA: Insufficient documentation

## 2016-02-27 DIAGNOSIS — N3289 Other specified disorders of bladder: Secondary | ICD-10-CM | POA: Diagnosis not present

## 2016-02-27 DIAGNOSIS — M4640 Discitis, unspecified, site unspecified: Secondary | ICD-10-CM

## 2016-02-27 DIAGNOSIS — N281 Cyst of kidney, acquired: Secondary | ICD-10-CM | POA: Diagnosis not present

## 2016-02-27 DIAGNOSIS — E669 Obesity, unspecified: Secondary | ICD-10-CM | POA: Diagnosis present

## 2016-02-27 DIAGNOSIS — M546 Pain in thoracic spine: Secondary | ICD-10-CM | POA: Diagnosis not present

## 2016-02-27 DIAGNOSIS — L97509 Non-pressure chronic ulcer of other part of unspecified foot with unspecified severity: Secondary | ICD-10-CM | POA: Diagnosis present

## 2016-02-27 DIAGNOSIS — G8929 Other chronic pain: Secondary | ICD-10-CM | POA: Diagnosis present

## 2016-02-27 DIAGNOSIS — D509 Iron deficiency anemia, unspecified: Secondary | ICD-10-CM | POA: Diagnosis not present

## 2016-02-27 DIAGNOSIS — W1839XA Other fall on same level, initial encounter: Secondary | ICD-10-CM | POA: Insufficient documentation

## 2016-02-27 DIAGNOSIS — M6289 Other specified disorders of muscle: Secondary | ICD-10-CM

## 2016-02-27 DIAGNOSIS — M549 Dorsalgia, unspecified: Secondary | ICD-10-CM | POA: Diagnosis present

## 2016-02-27 DIAGNOSIS — M6008 Infective myositis, other site: Secondary | ICD-10-CM

## 2016-02-27 DIAGNOSIS — Y929 Unspecified place or not applicable: Secondary | ICD-10-CM | POA: Diagnosis not present

## 2016-02-27 DIAGNOSIS — G061 Intraspinal abscess and granuloma: Principal | ICD-10-CM | POA: Diagnosis present

## 2016-02-27 DIAGNOSIS — F17210 Nicotine dependence, cigarettes, uncomplicated: Secondary | ICD-10-CM | POA: Diagnosis not present

## 2016-02-27 DIAGNOSIS — F0390 Unspecified dementia without behavioral disturbance: Secondary | ICD-10-CM | POA: Diagnosis present

## 2016-02-27 DIAGNOSIS — E11621 Type 2 diabetes mellitus with foot ulcer: Secondary | ICD-10-CM | POA: Diagnosis not present

## 2016-02-27 DIAGNOSIS — Z79899 Other long term (current) drug therapy: Secondary | ICD-10-CM | POA: Insufficient documentation

## 2016-02-27 DIAGNOSIS — E118 Type 2 diabetes mellitus with unspecified complications: Secondary | ICD-10-CM

## 2016-02-27 HISTORY — PX: OTHER SURGICAL HISTORY: SHX169

## 2016-02-27 LAB — COMPREHENSIVE METABOLIC PANEL
ALK PHOS: 257 U/L — AB (ref 38–126)
ALT: 35 U/L (ref 17–63)
ANION GAP: 12 (ref 5–15)
AST: 38 U/L (ref 15–41)
Albumin: 2.6 g/dL — ABNORMAL LOW (ref 3.5–5.0)
BUN: 24 mg/dL — ABNORMAL HIGH (ref 6–20)
CALCIUM: 9.1 mg/dL (ref 8.9–10.3)
CO2: 28 mmol/L (ref 22–32)
Chloride: 96 mmol/L — ABNORMAL LOW (ref 101–111)
Creatinine, Ser: 1.03 mg/dL (ref 0.61–1.24)
GFR calc non Af Amer: >60 mL/min (ref >60)
Glucose, Bld: 100 mg/dL — ABNORMAL HIGH (ref 65–99)
Potassium: 3.1 mmol/L — ABNORMAL LOW (ref 3.5–5.1)
SODIUM: 136 mmol/L (ref 135–145)
Total Bilirubin: 0.7 mg/dL (ref 0.3–1.2)
Total Protein: 7.5 g/dL (ref 6.5–8.1)

## 2016-02-27 LAB — URINALYSIS COMPLETE WITH MICROSCOPIC (ARMC ONLY)
Bilirubin Urine: NEGATIVE
GLUCOSE, UA: NEGATIVE mg/dL
Nitrite: NEGATIVE
PH: 5 (ref 5.0–8.0)
PROTEIN: 100 mg/dL — AB
SPECIFIC GRAVITY, URINE: 1.043 — AB (ref 1.005–1.030)
SQUAMOUS EPITHELIAL / LPF: NONE SEEN

## 2016-02-27 LAB — TROPONIN I

## 2016-02-27 LAB — CBC
HEMATOCRIT: 35.1 % — AB (ref 40.0–52.0)
Hemoglobin: 12 g/dL — ABNORMAL LOW (ref 13.0–18.0)
MCH: 24.5 pg — ABNORMAL LOW (ref 26.0–34.0)
MCHC: 34.3 g/dL (ref 32.0–36.0)
MCV: 71.4 fL — ABNORMAL LOW (ref 80.0–100.0)
Platelets: 478 10*3/uL — ABNORMAL HIGH (ref 150–440)
RBC: 4.92 MIL/uL (ref 4.40–5.90)
RDW: 17.6 % — AB (ref 11.5–14.5)
WBC: 14.4 10*3/uL — ABNORMAL HIGH (ref 3.8–10.6)

## 2016-02-27 LAB — LIPASE, BLOOD: Lipase: 18 U/L (ref 11–51)

## 2016-02-27 MED ORDER — POTASSIUM CHLORIDE 10 MEQ/100ML IV SOLN
10.0000 meq | INTRAVENOUS | Status: AC
  Administered 2016-02-27 (×2): 10 meq via INTRAVENOUS
  Filled 2016-02-27 (×2): qty 100

## 2016-02-27 MED ORDER — MORPHINE SULFATE (PF) 4 MG/ML IV SOLN
4.0000 mg | Freq: Once | INTRAVENOUS | Status: AC
  Administered 2016-02-27: 4 mg via INTRAVENOUS
  Filled 2016-02-27: qty 1

## 2016-02-27 MED ORDER — FENTANYL CITRATE (PF) 100 MCG/2ML IJ SOLN
75.0000 ug | Freq: Once | INTRAMUSCULAR | Status: AC
  Administered 2016-02-27: 75 ug via INTRAVENOUS
  Filled 2016-02-27: qty 2

## 2016-02-27 MED ORDER — HYDROMORPHONE HCL 1 MG/ML IJ SOLN
1.0000 mg | Freq: Once | INTRAMUSCULAR | Status: AC
  Administered 2016-02-27: 1 mg via INTRAVENOUS

## 2016-02-27 MED ORDER — ONDANSETRON HCL 4 MG/2ML IJ SOLN
4.0000 mg | Freq: Once | INTRAMUSCULAR | Status: AC
  Administered 2016-02-27: 4 mg via INTRAVENOUS
  Filled 2016-02-27: qty 2

## 2016-02-27 MED ORDER — HYDROMORPHONE HCL 1 MG/ML IJ SOLN
INTRAMUSCULAR | Status: AC
  Administered 2016-02-27: 1 mg via INTRAVENOUS
  Filled 2016-02-27: qty 1

## 2016-02-27 MED ORDER — IOPAMIDOL (ISOVUE-300) INJECTION 61%
100.0000 mL | Freq: Once | INTRAVENOUS | Status: AC | PRN
  Administered 2016-02-27: 100 mL via INTRAVENOUS

## 2016-02-27 MED ORDER — HYDROMORPHONE HCL 1 MG/ML IJ SOLN
1.0000 mg | Freq: Once | INTRAMUSCULAR | Status: AC
  Administered 2016-02-27: 1 mg via INTRAVENOUS
  Filled 2016-02-27: qty 1

## 2016-02-27 MED ORDER — IOPAMIDOL (ISOVUE-300) INJECTION 61%
30.0000 mL | Freq: Once | INTRAVENOUS | Status: AC
  Administered 2016-02-27: 30 mL via ORAL

## 2016-02-27 MED ORDER — VANCOMYCIN HCL 10 G IV SOLR
1500.0000 mg | Freq: Once | INTRAVENOUS | Status: AC
  Administered 2016-02-27: 1500 mg via INTRAVENOUS
  Filled 2016-02-27: qty 1500

## 2016-02-27 MED ORDER — DEXTROSE 5 % IV SOLN
2.0000 g | Freq: Once | INTRAVENOUS | Status: AC
  Administered 2016-02-27: 2 g via INTRAVENOUS
  Filled 2016-02-27: qty 2

## 2016-02-27 NOTE — ED Notes (Addendum)
[facility] present to transport pt to [facility]. All belongings sent with family (son Masonjames Schieffer).

## 2016-02-27 NOTE — ED Provider Notes (Signed)
[facility] Emergency Department Provider Note  ____________________________________________  Time seen: Approximately 3:23 PM  I have reviewed the triage vital signs and the nursing notes.   HISTORY  Chief Complaint Abdominal Pain    HPI Jarrette Baich is a 75 y.o. male with a history of dementia, DM, HTN, chronic pain presenting for abdominal pain, constipation, nausea, falls. The patient has had a progressive decline over the last several months. 2 weeks ago he was discharged from a rehabilitation facility because he was unwilling to participate in physical therapy due to uncontrolled pain. Since then, he was been living with his alcoholic brother who has been overdosing his tramadol and Xanax and underdosing his insulin. He has sustained multiple falls, including one today where he tried to stand up and fell against a dresser hitting his right hip.  He also reports that he has not had a bowel movement in greater than 10 days and is no longer passing gas. He has had nausea without vomiting. He is able to drink water but is not eating anymore. He denies any fever, chills, urinary symptoms.   Past Medical History:  Diagnosis Date  . Anxiety   . Chronic back pain   . Dementia   . Diabetes ([place])   . Hypertension     Patient Active Problem List   Diagnosis Date Noted  . Diabetes ([place])   . Pressure ulcer 02/07/2016  . AKI (acute kidney injury) ([place]) 02/05/2016    Past Surgical History:  Procedure Laterality Date  . APPENDECTOMY      Current Outpatient Rx  . Order #: [id number] Class: Print  . Order #: [id number] Class: Historical Med  . Order #: [id number] Class: Historical Med  . Order #: [id number] Class: Historical Med  . Order #: [id number] Class: Normal  . Order #: [id number] Class: Normal  . Order #: [id number] Class: Historical Med  . Order #: [id number] Class: Historical Med  . Order #: [id number] Class: Historical Med  . Order #: [id number] Class: Historical Med   . Order #: [id number] Class: Print  . Order #: [id number] Class: Historical Med  . Order #: [id number] Class: Historical Med  . Order #: [id number] Class: Historical Med  . Order #: [id number] Class: Print  . Order #: [id number] Class: Historical Med    Allergies Review of patient's allergies indicates no known allergies.  No family history on file.  Social History Social History  Substance Use Topics  . Smoking status: Current Every Day Smoker    Packs/day: 0.50    Types: Cigarettes  . Smokeless tobacco: Never Used  . Alcohol use No    Review of Systems Constitutional: No fever/chills. No lightheadedness or syncope. Positive fall without loss of consciousness. Did not hit head. Eyes: No visual changes. No blurred or double vision. ENT: No sore throat. No congestion or rhinorrhea. Cardiovascular: Denies chest pain. Denies palpitations. Respiratory: Denies shortness of breath.  No cough. Gastrointestinal: As of diffuse abdominal pain.  Positive nausea, no vomiting.  No diarrhea.  Positive constipation. No longer passing gas. Genitourinary: Negative for dysuria. Musculoskeletal: Positive for back pain. Skin: Negative for rash. Neurological: Negative for headaches. No focal numbness, tingling or weakness.   10-point ROS otherwise negative.  ____________________________________________   PHYSICAL EXAM:  VITAL SIGNS: ED Triage Vitals  Enc Vitals Group     BP 02/27/16 1321 139/74     Pulse Rate 02/27/16 1321 88     Resp 02/27/16 1321 (!) 25     Temp 02/27/16 1321 97.6 F (36.4 C)     Temp Source 02/27/16  1321 Oral     SpO2 02/27/16 1321 99 %     Weight 02/27/16 1321 185 lb (83.9 kg)     Height 02/27/16 1321 5\' 10"  (1.778 m)     Head Circumference --      Peak Flow --      Pain Score 02/27/16 1322 10     Pain Loc --      Pain Edu? --      Excl. in [place]? --     Constitutional: Alert and oriented. Chronically ill appearing but nontoxic. Answers questions appropriately. Eyes:  Conjunctivae are normal.  EOMI. No scleral icterus. No orbital ecchymosis. Head: Atraumatic. No raccoon eyes or Battle sign. Nose: No congestion/rhinnorhea. No swelling over the nose. No septal hematoma. Mouth/Throat: Mucous membranes are dry.  Neck: No stridor.  Supple.  No midline C-spine tenderness to palpation, step-offs or deformities. No JVD. Cardiovascular: Normal rate, regular rhythm. No murmurs, rubs or gallops.  Respiratory: Normal respiratory effort.  No accessory muscle use or retractions. Lungs CTAB.  No wheezes, rales or ronchi. Gastrointestinal: Soft and distended.  Diffuse tenderness to palpation with more focal pain in the right lower quadrant. No guarding or rebound.  No peritoneal signs. Genitourinary: Malodorous urine Musculoskeletal: Full range of motion of the bilateral ankles, knees, left hip. Pain with range of motion of the right hip and over the greater trochanter. Normal DP and PT pulses bilaterally. Ecchymosis over the lower left thoracic cage without significant swelling or crepitus. No midline thoracic or lumbar spine tenderness to palpation, step-offs or deformities.. Neurologic:  A&Ox3.  Speech is clear.  Face and smile are symmetric.  EOMI.  Moves all extremities well. Skin:  Skin is warm, dry and intact. No rash noted. Psychiatric: Mood and affect are normal. Speech and behavior are normal.  Normal judgement.  ____________________________________________   LABS (all labs ordered are listed, but only abnormal results are displayed)  Labs Reviewed  CBC - Abnormal; Notable for the following:       Result Value   WBC 14.4 (*)    Hemoglobin 12.0 (*)    HCT 35.1 (*)    MCV 71.4 (*)    MCH 24.5 (*)    RDW 17.6 (*)    Platelets 478 (*)    All other components within normal limits  COMPREHENSIVE METABOLIC PANEL - Abnormal; Notable for the following:    Potassium 3.1 (*)    Chloride 96 (*)    Glucose, Bld 100 (*)    BUN 24 (*)    Albumin 2.6 (*)    Alkaline  Phosphatase 257 (*)    All other components within normal limits  CULTURE, BLOOD (ROUTINE X 2)  CULTURE, BLOOD (ROUTINE X 2)  URINE CULTURE  TROPONIN I  LIPASE, BLOOD  URINALYSIS COMPLETEWITH MICROSCOPIC ([facility] ONLY)   ____________________________________________  EKG  ED ECG REPORT I, Eula Listen, the attending physician, personally viewed and interpreted this ECG.   Date: 02/27/2016  EKG Time: 1320  Rate: 86  Rhythm: normal sinus rhythm  Axis: normal  Intervals:none  ST&T Change: No ST elevation.  ____________________________________________  RADIOLOGY  Dg Chest 2 View  Result Date: 02/27/2016 CLINICAL DATA:  Increasing confusion. EXAM: CHEST  2 VIEW COMPARISON:  02/05/2016. FINDINGS: Cardiomegaly. Asymmetric opacity at the RIGHT base, with elevated hemidiaphragm, not present previously, suspicious for early pneumonia. Old healed BILATERAL rib fractures. No pneumothorax. Calcified tortuous aorta. IMPRESSION: Cardiomegaly.  RIGHT lower lobe infiltrate not excluded. Electronically Signed   By: Jenny Reichmann  Alfonse Flavors M.D.   On: 02/27/2016 15:57   Ct Abdomen Pelvis W Contrast  Result Date: 02/27/2016 CLINICAL DATA:  Recent multiple falls, chronic abdominal pain, worse in the left upper quadrant. Confusion. EXAM: CT ABDOMEN AND PELVIS WITH CONTRAST TECHNIQUE: Multidetector CT imaging of the abdomen and pelvis was performed using the standard protocol following bolus administration of intravenous contrast. CONTRAST:  132mL ISOVUE-300 IOPAMIDOL (ISOVUE-300) INJECTION 61% COMPARISON:  02/27/2016 chest x-ray FINDINGS: Lower chest: Minor right base dependent atelectasis. Lung bases otherwise clear. No pericardial or pleural effusion. Native coronary atherosclerosis. Lower thoracic aortic atherosclerosis as well. Hepatobiliary: No focal hepatic abnormality or intrahepatic biliary dilatation. Patent portal vein. Layering slight hyper attenuation within the gallbladder, suspicious for  gallstones or sludge. Pancreas: Very thin in appearance, mildly atrophic. No focal abnormality or ductal dilatation. Spleen: Normal in size without focal abnormality. Adrenals/Urinary Tract: Normal adrenal glands. Scattered renal hypodense cysts larger on the left. Largest left renal cyst measures 28 mm, image 49. No renal obstruction or hydronephrosis. No ureteral dilatation or ureteral calculus. Bladder demonstrates a focal right posterior wall nodule/mass measuring 2.5 cm. Recommend follow-up nonemergent urologic consultation for cystoscopy evaluation. Stomach/Bowel: Negative for bowel obstruction, significant dilatation, ileus, or free air. Appendix not visualized. No fluid collection or abscess. No ascites. Vascular/Lymphatic: Extensive aortoiliac atherosclerosis without aneurysm or occlusive process. No retroperitoneal hemorrhage or hematoma. No adenopathy. Reproductive: Prostate normal in size. Seminal vesicles symmetric. Penile prosthesis noted. Other: No abdominal wall hernia or abnormality. No abdominopelvic ascites. Musculoskeletal: T11-12 surrounding paraspinous soft tissue abnormality with edema and enhancement. Endplate irregularity at T11 and T12. Appearance compatible with T11-12 spondylo discitis. There is associated inferior endplate fracture at 624THL. No severe compression deformity or focal kyphosis. By CT, there is the suggestion of a small right paraspinous fluid collection measuring 18 x 9 mm, image 22. This is concerning for paraspinous abscess. Diffuse degenerative changes of the lumbar spine with vacuum disc phenomena at L5. IMPRESSION: CT findings concerning for acute spondylo discitis at T11-12 with an associated T11 inferior endplate fracture. Question early small right paraspinous abscess at T11-12 measuring 18 x 9 mm. Consider further evaluation with MRI without and with contrast. Coronary and diffuse thoracoabdominal aortic atherosclerosis Layering sludge versus stones in the  gallbladder Incidental renal cysts 2.5 cm right posterior bladder nodular mass warrants urologic follow-up for cystoscopy. These results were called by telephone at the time of interpretation on 02/27/2016 at 5:27 pm to Dr. Eula Listen , who verbally acknowledged these results. Electronically Signed   By: Jerilynn Mages.  Shick M.D.   On: 02/27/2016 17:29    ____________________________________________   PROCEDURES  Procedure(s) performed: None  Procedures  Critical Care performed: Yes ____________________________________________   INITIAL IMPRESSION / ASSESSMENT AND PLAN / ED COURSE  Pertinent labs & imaging results that were available during my care of the patient were reviewed by me and considered in my medical decision making (see chart for details).  75 y.o. male presenting with abdominal pain and distention, constipation and nausea, as well as frequent falls. Overall, the patient describes a progressive decline and failure to thrive in his home environment. From an abdominal standpoint, I am concerned about constipation versus obstruction or ileus given that the patient is not active. Get a CT scan for further evaluation, and we will also get basic labs. In addition, we'll get imaging of the right hip and chest for trauma evaluation. The patient will require admission to the hospital.  ----------------------------------------- 5:39 PM on 02/27/2016 -----------------------------------------  The  patient's CT abdomen and pelvis does not show any acute abdominal pathology or pelvic fractures. However, the patient does have a T11 inferior endplate fracture with 624THL and T12 spondylosis discitis and is associated paraspinal abscess. I have ordered vancomycin and ceftriaxone to be hung immediately and the patient will be fully cultured prior to antibiotics. In addition, I have contacted [facility] for transfer for neurosurgical evaluation and treatment.    At this time, the patient continues  to remain hemodynamically stable and his pain has significantly improved since his arrival.  CRITICAL CARE Performed by: Eula Listen   Total critical care time: 45 minutes  Critical care time was exclusive of separately billable procedures and treating other patients.  Critical care was necessary to treat or prevent imminent or life-threatening deterioration.  Critical care was time spent personally by me on the following activities: development of treatment plan with patient and/or surrogate as well as nursing, discussions with consultants, evaluation of patient's response to treatment, examination of patient, obtaining history from patient or surrogate, ordering and performing treatments and interventions, ordering and review of laboratory studies, ordering and review of radiographic studies, pulse oximetry and re-evaluation of patient's condition.  ----------------------------------------- 6:50 PM on 02/27/2016 -----------------------------------------  Approximate 30 minutes ago, I spoke with Dr. Ellene Route, the neurosurgeon on call for [facility]. His recommendation was a hospitalist admission for transfer given that the patient will not have surgical intervention. His recommendation was IV antibiotics with interventional radiology for culture of the abscess. I have now spoken with [facility], who has accepted the patient  internal medicine admission. ____________________________________________  FINAL CLINICAL IMPRESSION(S) / ED DIAGNOSES  Final diagnoses:  Spondylodiscitis  Other closed fracture of eleventh thoracic vertebra, initial encounter ([place])  Abscess of paraspinous muscles  Frequent falls  Constipation, unspecified constipation type    Clinical Course      NEW MEDICATIONS STARTED DURING THIS VISIT:  New Prescriptions   No medications on file      Eula Listen, MD 02/27/16 1851

## 2016-02-27 NOTE — ED Notes (Signed)
Patient transported to CT 

## 2016-02-27 NOTE — Progress Notes (Signed)
Patient ID: Dennis Haynes, male   DOB: [birth year], 75 y.o.   MRN: [id number] Please call the floor manager at extension [phone] for admitting physician assignment once the patient arrives to the floor.  75 year old male who was initially evaluated for abdominal pain, constipation nausea and falls with progressive decline over the past several months. CT scan of the abdomen was performed and incidentally the patient had a T11 inferior endplate fracture with possible right paraspinal abscesses at T11-12. Has a white count of 14,000, hemoglobin of 12.0, platelets 478, potassium 3.1 mmol/L, albumin 2.6 g/dL. Blood cultures, urinalysis and urine culture and sensitivity are still pending. The case was discussed with [facility] neuro[facility] on call, who suggested transfer and admission to the hospitalist service for IV antibiotic therapy. They will be consulting on the case.   Accepted to telemetry bed for further evaluation of Acute spondyli discitis with an associated T11 inferior endplate fracture. Question early small right paraspinous abscesses at T11-12 measuring 18 x 9 mm. He will need MRI of thoracic spine, continuation of IV antibiotics and notify Dr. Ellene Route (neurosurgery).the patient has arrived.  -------------------------------------------------------------------------------------------------------------------------------------------  Per Dr. Mariea Clonts.  Chief Complaint Abdominal Pain    HPI Dennis Haynes is a 75 y.o. male with a history of dementia, DM, HTN, chronic pain presenting for abdominal pain, constipation, nausea, falls. The patient has had a progressive decline over the last several months. 2 weeks ago he was discharged from a rehabilitation facility because he was unwilling to participate in physical therapy due to uncontrolled pain. Since then, he was been living with his alcoholic brother who has been overdosing his tramadol and Xanax and underdosing his insulin. He has sustained multiple  falls, including one today where he tried to stand up and fell against a dresser hitting his right hip.  He also reports that he has not had a bowel movement in greater than 10 days and is no longer passing gas. He has had nausea without vomiting. He is able to drink water but is not eating anymore. He denies any fever, chills, urinary symptoms.  Specimen Source: Urine, Random   CBC [id number] (Abnormal) Collected: 02/27/16 1340  Updated: 02/27/16 1748   Specimen Type: Blood   Specimen Source: Vein    WBC 14.4 (H) K/uL   RBC 4.92 MIL/uL   Hemoglobin 12.0 (L) g/dL   HCT 35.1 (L) %   MCV 71.4 (L) fL   MCH 24.5 (L) pg   MCHC 34.3 g/dL   RDW 17.6 (H) %   Platelets 478 (H) K/uL  Troponin I [id number] Collected: 02/27/16 1445  Updated: 02/27/16 1619   Specimen Type: Blood    Troponin I <0.03 ng/mL  Comprehensive metabolic panel [id number] (Abnormal) Collected: 02/27/16 1445  Updated: 02/27/16 1619    Sodium 136 mmol/L   Potassium 3.1 (L) mmol/L   Chloride 96 (L) mmol/L   CO2 28 mmol/L   Glucose, Bld 100 (H) mg/dL   BUN 24 (H) mg/dL   Creatinine, Ser 1.03 mg/dL   Calcium 9.1 mg/dL   Total Protein 7.5 g/dL   Albumin 2.6 (L) g/dL   AST 38 U/L   ALT 35 U/L   Alkaline Phosphatase 257 (H) U/L   Total Bilirubin 0.7 mg/dL   GFR calc non Af Amer >60 mL/min   GFR calc Af Amer >60 mL/min   Anion gap 12  Lipase, blood [id number] Collected: 02/27/16 1445  Updated: 02/27/16 1619    Lipase 18 U/L   EKG Vent. rate 86 BPM  PR interval * ms QRS duration 113 ms QT/QTc 388/465 ms P-R-T axes 72 87 49 Sinus rhythm Atrial premature complex Borderline intraventricular conduction delay  CT scan abdomen/pelvis IMPRESSION: CT findings concerning for acute spondylo discitis at T11-12 with an associated T11 inferior endplate fracture. Question early small right paraspinous abscess at T11-12 measuring 18 x 9 mm. Consider further evaluation with MRI without and with contrast.  Coronary  and diffuse thoracoabdominal aortic atherosclerosis  Layering sludge versus stones in the gallbladder  Incidental renal cysts  2.5 cm right posterior bladder nodular mass warrants urologic follow-up for cystoscopy.  These results were called by telephone at the time of interpretation on 02/27/2016 at 5:27 pm to Dr. Eula Listen , who verbally acknowledged these results.  Electronically Signed   By: Jerilynn Mages.  Shick M.D.   On: 02/27/2016 17:29    Tennis Must, M.D. Pager  [phone].

## 2016-02-27 NOTE — Progress Notes (Signed)
Pt transferred from [facility] via [facility]; arrived on the unit via stretcher; moaning and groaning in pain and stated wants something to get rid of the pain on his back. Family on the waiting room.

## 2016-02-27 NOTE — ED Triage Notes (Signed)
Per [facility], patient comes from home. Patient lives with elderly brother. EMS reports being called 2-3 x daily due to falls. Patient c/o abd pain x 6 months. Patient states he has not had a bowel movement in 10 days. Patient moaning out in pain, guarding LLQ. Per family, patient has been increasingly confused and his urine has been dark. Hx of diabetes CBG 120 and HTN. Patient was recently at [facility] but "kicked out" due to non compliance. A&O x4.

## 2016-02-28 ENCOUNTER — Inpatient Hospital Stay (HOSPITAL_COMMUNITY): Payer: Medicare Other

## 2016-02-28 ENCOUNTER — Encounter (HOSPITAL_COMMUNITY): Payer: Self-pay | Admitting: Internal Medicine

## 2016-02-28 DIAGNOSIS — S22089A Unspecified fracture of T11-T12 vertebra, initial encounter for closed fracture: Secondary | ICD-10-CM | POA: Diagnosis present

## 2016-02-28 DIAGNOSIS — I10 Essential (primary) hypertension: Secondary | ICD-10-CM | POA: Diagnosis present

## 2016-02-28 DIAGNOSIS — M549 Dorsalgia, unspecified: Secondary | ICD-10-CM | POA: Diagnosis present

## 2016-02-28 DIAGNOSIS — M4644 Discitis, unspecified, thoracic region: Secondary | ICD-10-CM | POA: Diagnosis present

## 2016-02-28 DIAGNOSIS — M4640 Discitis, unspecified, site unspecified: Secondary | ICD-10-CM | POA: Diagnosis present

## 2016-02-28 HISTORY — PX: IR GENERIC HISTORICAL: IMG1180011

## 2016-02-28 LAB — CBC WITH DIFFERENTIAL/PLATELET
BASOS PCT: 0 %
Basophils Absolute: 0 10*3/uL (ref 0.0–0.1)
Eosinophils Absolute: 0.1 10*3/uL (ref 0.0–0.7)
Eosinophils Relative: 1 %
HEMATOCRIT: 34.2 % — AB (ref 39.0–52.0)
HEMOGLOBIN: 11 g/dL — AB (ref 13.0–17.0)
LYMPHS ABS: 1.6 10*3/uL (ref 0.7–4.0)
LYMPHS PCT: 17 %
MCH: 24 pg — ABNORMAL LOW (ref 26.0–34.0)
MCHC: 32.2 g/dL (ref 30.0–36.0)
MCV: 74.7 fL — AB (ref 78.0–100.0)
MONOS PCT: 10 %
Monocytes Absolute: 0.9 10*3/uL (ref 0.1–1.0)
NEUTROS ABS: 6.7 10*3/uL (ref 1.7–7.7)
NEUTROS PCT: 72 %
Platelets: 393 10*3/uL (ref 150–400)
RBC: 4.58 MIL/uL (ref 4.22–5.81)
RDW: 16.7 % — ABNORMAL HIGH (ref 11.5–15.5)
WBC: 9.4 10*3/uL (ref 4.0–10.5)

## 2016-02-28 LAB — COMPREHENSIVE METABOLIC PANEL
ALT: 37 U/L (ref 17–63)
ANION GAP: 9 (ref 5–15)
AST: 38 U/L (ref 15–41)
Albumin: 2.1 g/dL — ABNORMAL LOW (ref 3.5–5.0)
Alkaline Phosphatase: 219 U/L — ABNORMAL HIGH (ref 38–126)
BUN: 18 mg/dL (ref 6–20)
CHLORIDE: 98 mmol/L — AB (ref 101–111)
CO2: 25 mmol/L (ref 22–32)
Calcium: 8.3 mg/dL — ABNORMAL LOW (ref 8.9–10.3)
Creatinine, Ser: 1.01 mg/dL (ref 0.61–1.24)
GFR calc non Af Amer: >60 mL/min (ref >60)
Glucose, Bld: 143 mg/dL — ABNORMAL HIGH (ref 65–99)
POTASSIUM: 3.3 mmol/L — AB (ref 3.5–5.1)
SODIUM: 132 mmol/L — AB (ref 135–145)
Total Bilirubin: 0.7 mg/dL (ref 0.3–1.2)
Total Protein: 6 g/dL — ABNORMAL LOW (ref 6.5–8.1)

## 2016-02-28 LAB — GLUCOSE, CAPILLARY
GLUCOSE-CAPILLARY: 125 mg/dL — AB (ref 65–99)
Glucose-Capillary: 143 mg/dL — ABNORMAL HIGH (ref 65–99)
Glucose-Capillary: 142 mg/dL — ABNORMAL HIGH (ref 65–99)
Glucose-Capillary: 119 mg/dL — ABNORMAL HIGH (ref 65–99)
Glucose-Capillary: 235 mg/dL — ABNORMAL HIGH (ref 65–99)

## 2016-02-28 LAB — APTT: APTT: 36 s (ref 24–36)

## 2016-02-28 LAB — PROTIME-INR
INR: 1.17
Prothrombin Time: 15 seconds (ref 11.4–15.2)

## 2016-02-28 MED ORDER — SODIUM CHLORIDE 0.9 % IV SOLN
INTRAVENOUS | Status: AC
  Administered 2016-02-28: 02:00:00 via INTRAVENOUS

## 2016-02-28 MED ORDER — FENTANYL CITRATE (PF) 100 MCG/2ML IJ SOLN
INTRAMUSCULAR | Status: AC
Start: 2016-02-28 — End: 2016-02-29
  Filled 2016-02-28: qty 2

## 2016-02-28 MED ORDER — MORPHINE SULFATE (PF) 2 MG/ML IV SOLN
2.0000 mg | INTRAVENOUS | Status: DC | PRN
  Administered 2016-02-28 – 2016-02-29 (×8): 4 mg via INTRAVENOUS
  Filled 2016-02-28 (×9): qty 2

## 2016-02-28 MED ORDER — METOPROLOL TARTRATE 50 MG PO TABS
50.0000 mg | ORAL_TABLET | Freq: Two times a day (BID) | ORAL | Status: DC
  Administered 2016-02-28 – 2016-03-05 (×14): 50 mg via ORAL
  Filled 2016-02-28 (×14): qty 1

## 2016-02-28 MED ORDER — ACETAMINOPHEN 650 MG RE SUPP: 650.0000 mg | Freq: Four times a day (QID) | RECTAL | Status: DC | PRN

## 2016-02-28 MED ORDER — OXYCODONE HCL 5 MG PO TABS
5.0000 mg | ORAL_TABLET | Freq: Four times a day (QID) | ORAL | Status: DC | PRN
  Administered 2016-02-28 – 2016-03-02 (×10): 5 mg via ORAL
  Filled 2016-02-28 (×12): qty 1

## 2016-02-28 MED ORDER — MIDAZOLAM HCL 2 MG/2ML IJ SOLN
INTRAMUSCULAR | Status: AC
  Filled 2016-02-28: qty 4

## 2016-02-28 MED ORDER — MORPHINE SULFATE (PF) 2 MG/ML IV SOLN
2.0000 mg | INTRAVENOUS | Status: DC | PRN
  Administered 2016-02-28: 2 mg via INTRAVENOUS
  Filled 2016-02-28: qty 1

## 2016-02-28 MED ORDER — POTASSIUM CHLORIDE 10 MEQ/100ML IV SOLN
10.0000 meq | INTRAVENOUS | Status: AC
  Administered 2016-02-28 (×2): 10 meq via INTRAVENOUS
  Filled 2016-02-28 (×4): qty 100

## 2016-02-28 MED ORDER — ATORVASTATIN CALCIUM 80 MG PO TABS
80.0000 mg | ORAL_TABLET | Freq: Every day | ORAL | Status: DC
  Administered 2016-02-28 – 2016-03-04 (×6): 80 mg via ORAL
  Filled 2016-02-28 (×7): qty 1

## 2016-02-28 MED ORDER — SERTRALINE HCL 100 MG PO TABS
100.0000 mg | ORAL_TABLET | Freq: Every day | ORAL | Status: DC
  Administered 2016-02-28 – 2016-03-05 (×7): 100 mg via ORAL
  Filled 2016-02-28 (×8): qty 1

## 2016-02-28 MED ORDER — INSULIN ASPART 100 UNIT/ML ~~LOC~~ SOLN
0.0000 [IU] | Freq: Three times a day (TID) | SUBCUTANEOUS | Status: DC
  Administered 2016-02-29: 2 [IU] via SUBCUTANEOUS
  Administered 2016-03-01: 5 [IU] via SUBCUTANEOUS
  Administered 2016-03-01 (×2): 3 [IU] via SUBCUTANEOUS
  Administered 2016-03-02: 2 [IU] via SUBCUTANEOUS
  Administered 2016-03-02 (×2): 3 [IU] via SUBCUTANEOUS
  Administered 2016-03-03: 2 [IU] via SUBCUTANEOUS
  Administered 2016-03-03 (×2): 3 [IU] via SUBCUTANEOUS
  Administered 2016-03-03 – 2016-03-04 (×2): 2 [IU] via SUBCUTANEOUS
  Administered 2016-03-04: 1 [IU] via SUBCUTANEOUS
  Administered 2016-03-04: 2 [IU] via SUBCUTANEOUS
  Administered 2016-03-05 (×2): 1 [IU] via SUBCUTANEOUS

## 2016-02-28 MED ORDER — MIRTAZAPINE 15 MG PO TABS
30.0000 mg | ORAL_TABLET | Freq: Every day | ORAL | Status: DC
  Administered 2016-02-28 – 2016-03-04 (×7): 30 mg via ORAL
  Filled 2016-02-28 (×7): qty 2

## 2016-02-28 MED ORDER — FENTANYL CITRATE (PF) 100 MCG/2ML IJ SOLN
INTRAMUSCULAR | Status: AC
  Filled 2016-02-28: qty 2

## 2016-02-28 MED ORDER — ONDANSETRON HCL 4 MG/2ML IJ SOLN: 4.0000 mg | Freq: Four times a day (QID) | INTRAMUSCULAR | Status: DC | PRN

## 2016-02-28 MED ORDER — OXYCODONE-ACETAMINOPHEN 10-325 MG PO TABS: 1.0000 | ORAL_TABLET | Freq: Four times a day (QID) | ORAL | Status: DC | PRN

## 2016-02-28 MED ORDER — PANTOPRAZOLE SODIUM 40 MG PO TBEC
40.0000 mg | DELAYED_RELEASE_TABLET | Freq: Every day | ORAL | Status: DC
  Administered 2016-02-28 – 2016-03-05 (×7): 40 mg via ORAL
  Filled 2016-02-28 (×7): qty 1

## 2016-02-28 MED ORDER — ZOLPIDEM TARTRATE 5 MG PO TABS
5.0000 mg | ORAL_TABLET | Freq: Every evening | ORAL | Status: DC | PRN
  Administered 2016-02-29 – 2016-03-03 (×3): 5 mg via ORAL
  Filled 2016-02-28 (×3): qty 1

## 2016-02-28 MED ORDER — INSULIN DETEMIR 100 UNIT/ML ~~LOC~~ SOLN
50.0000 [IU] | Freq: Every day | SUBCUTANEOUS | Status: DC
  Administered 2016-02-28: 50 [IU] via SUBCUTANEOUS
  Filled 2016-02-28 (×2): qty 0.5

## 2016-02-28 MED ORDER — RISPERIDONE 0.5 MG PO TABS
0.2500 mg | ORAL_TABLET | Freq: Two times a day (BID) | ORAL | Status: DC
Start: 2016-02-28 — End: 2016-03-05
  Administered 2016-02-28 – 2016-03-05 (×14): 0.25 mg via ORAL
  Filled 2016-02-28 (×14): qty 1

## 2016-02-28 MED ORDER — INSULIN ASPART 100 UNIT/ML ~~LOC~~ SOLN
10.0000 [IU] | Freq: Three times a day (TID) | SUBCUTANEOUS | Status: DC
  Administered 2016-02-28 – 2016-02-29 (×3): 10 [IU] via SUBCUTANEOUS

## 2016-02-28 MED ORDER — ALPRAZOLAM 0.5 MG PO TABS
0.5000 mg | ORAL_TABLET | Freq: Three times a day (TID) | ORAL | Status: DC | PRN
  Administered 2016-02-28 – 2016-03-05 (×11): 0.5 mg via ORAL
  Filled 2016-02-28 (×12): qty 1

## 2016-02-28 MED ORDER — INSULIN DETEMIR 100 UNIT/ML ~~LOC~~ SOLN
25.0000 [IU] | Freq: Every day | SUBCUTANEOUS | Status: DC
  Administered 2016-02-28: 25 [IU] via SUBCUTANEOUS
  Filled 2016-02-28 (×2): qty 0.25

## 2016-02-28 MED ORDER — MIDAZOLAM HCL 2 MG/2ML IJ SOLN
INTRAMUSCULAR | Status: AC | PRN
  Administered 2016-02-28: 0.5 mg via INTRAVENOUS
  Administered 2016-02-28 (×2): 1 mg via INTRAVENOUS
  Administered 2016-02-28: 0.5 mg via INTRAVENOUS

## 2016-02-28 MED ORDER — PREGABALIN 100 MG PO CAPS
300.0000 mg | ORAL_CAPSULE | Freq: Two times a day (BID) | ORAL | Status: DC
  Administered 2016-02-28 – 2016-03-05 (×14): 300 mg via ORAL
  Filled 2016-02-28 (×15): qty 3

## 2016-02-28 MED ORDER — METHOCARBAMOL 1000 MG/10ML IJ SOLN
500.0000 mg | Freq: Three times a day (TID) | INTRAVENOUS | Status: DC | PRN
  Administered 2016-03-01 – 2016-03-05 (×2): 500 mg via INTRAVENOUS
  Filled 2016-02-28 (×5): qty 5

## 2016-02-28 MED ORDER — PIPERACILLIN-TAZOBACTAM 3.375 G IVPB
3.3750 g | Freq: Three times a day (TID) | INTRAVENOUS | Status: DC
  Administered 2016-02-28 – 2016-03-02 (×11): 3.375 g via INTRAVENOUS
  Filled 2016-02-28 (×14): qty 50

## 2016-02-28 MED ORDER — MIDAZOLAM HCL 2 MG/2ML IJ SOLN
INTRAMUSCULAR | Status: AC
  Filled 2016-02-28: qty 2

## 2016-02-28 MED ORDER — LIDOCAINE HCL 1 % IJ SOLN
INTRAMUSCULAR | Status: AC | PRN
  Administered 2016-02-28: 5 mL

## 2016-02-28 MED ORDER — SENNA 8.6 MG PO TABS
1.0000 | ORAL_TABLET | Freq: Every day | ORAL | Status: DC
  Administered 2016-02-28 – 2016-03-01 (×3): 8.6 mg via ORAL
  Filled 2016-02-28 (×4): qty 1

## 2016-02-28 MED ORDER — VANCOMYCIN HCL IN DEXTROSE 750-5 MG/150ML-% IV SOLN
750.0000 mg | Freq: Two times a day (BID) | INTRAVENOUS | Status: DC
  Administered 2016-02-28 – 2016-03-01 (×5): 750 mg via INTRAVENOUS
  Filled 2016-02-28 (×7): qty 150

## 2016-02-28 MED ORDER — ACETAMINOPHEN 325 MG PO TABS: 650.0000 mg | ORAL_TABLET | Freq: Four times a day (QID) | ORAL | Status: DC | PRN

## 2016-02-28 MED ORDER — FENTANYL CITRATE (PF) 100 MCG/2ML IJ SOLN
INTRAMUSCULAR | Status: AC | PRN
  Administered 2016-02-28: 50 ug via INTRAVENOUS
  Administered 2016-02-28 (×3): 25 ug via INTRAVENOUS

## 2016-02-28 MED ORDER — LIDOCAINE HCL 1 % IJ SOLN
INTRAMUSCULAR | Status: AC
  Filled 2016-02-28: qty 20

## 2016-02-28 MED ORDER — OXYCODONE-ACETAMINOPHEN 5-325 MG PO TABS
1.0000 | ORAL_TABLET | Freq: Four times a day (QID) | ORAL | Status: DC | PRN
  Administered 2016-02-28 – 2016-03-02 (×9): 1 via ORAL
  Filled 2016-02-28 (×11): qty 1

## 2016-02-28 MED ORDER — ASPIRIN EC 81 MG PO TBEC
81.0000 mg | DELAYED_RELEASE_TABLET | Freq: Every day | ORAL | Status: DC
  Administered 2016-02-28 – 2016-03-05 (×7): 81 mg via ORAL
  Filled 2016-02-28 (×7): qty 1

## 2016-02-28 MED ORDER — ONDANSETRON HCL 4 MG PO TABS: 4.0000 mg | ORAL_TABLET | Freq: Four times a day (QID) | ORAL | Status: DC | PRN

## 2016-02-28 MED ORDER — POTASSIUM CHLORIDE 10 MEQ/100ML IV SOLN
10.0000 meq | INTRAVENOUS | Status: AC
  Administered 2016-02-28 (×2): 10 meq via INTRAVENOUS
  Filled 2016-02-28 (×3): qty 100

## 2016-02-28 NOTE — Progress Notes (Signed)
Pharmacy Antibiotic Note  Dennis Haynes is a 75 y.o. male admitted on 02/27/2016 with discitis/possible abcess.  Pharmacy has been consulted for Vancomycin and Zosyn  Dosing.  Vancomycin 1500 mg IV given in ED at  1900  Plan: Vancomycin 750 mg IV q12h  Zosyn 3.375 g IV q8h      Temp (24hrs), Avg:97.8 F (36.6 C), Min:97.6 F (36.4 C), Max:97.9 F (36.6 C)   Recent Labs Lab 02/27/16 1340 02/27/16 1445  WBC 14.4*  --   CREATININE  --  1.03    Estimated Creatinine Clearance: 63 mL/min (by C-G formula based on SCr of 1.03 mg/dL).    No Known Allergies   Dennis Haynes 02/28/2016 1:09 AM

## 2016-02-28 NOTE — Sedation Documentation (Signed)
Pt refusing MRI states he cant lay still

## 2016-02-28 NOTE — H&P (Signed)
Chief Complaint: Back pain  Referring Physician(s): Rise Patience  Supervising Physician: Arne Cleveland  Patient Status: Inpatient  History of Present Illness: Dennis Haynes is a 75 y.o. male with diabetes mellitus type 2, hypertension, chronic back pain, hyperlipidemia who was admitted 3 weeks ago at [facility] for dehydration and UTI.  He was eventually discharged to a rehab facility but he left by his own will.  He presented to ED because of worsening back pain over the last 10 days.  He reports the pain is mostly in his right lower back and right side of his lower abdomen.   He states the pain is worse with movement. He also states "the pain medications are not working at all" and rates his pain 10/10.  CT of the abdomen and pelvis done is concerning for acute spondylo discitis at T11-12 with an associated T11 inferior endplate fracture. Question early small right paraspinous abscess at T11-12 measuring 18 x 9 mm.   Dr. Ellene Route with neurosurgery was contacted and he recommended IV antibiotics and IR consult for disc aspiration.  Dennis Haynes is NPO. He does not take blood thinners. Chart review shows no Lovenox or heparin given.  Past Medical History:  Diagnosis Date  . Anxiety   . Chronic back pain   . Dementia   . Diabetes ([place])   . Hypertension     Past Surgical History:  Procedure Laterality Date  . APPENDECTOMY      Allergies: Review of patient's allergies indicates no known allergies.  Medications: Prior to Admission medications   Medication Sig Start Date End Date Taking? Authorizing Provider  ALPRAZolam Duanne Moron) 0.5 MG tablet Take 1 tablet (0.5 mg total) by mouth 3 (three) times daily as needed for anxiety. 02/08/16   Henreitta Leber, MD  aspirin EC 81 MG tablet Take 1 tablet by mouth daily.    Historical Provider, MD  atorvastatin (LIPITOR) 80 MG tablet Take 80 mg by mouth daily.    Historical Provider, MD  hydrochlorothiazide  (HYDRODIURIL) 25 MG tablet Take 25 mg by mouth daily.    Historical Provider, MD  insulin aspart (NOVOLOG) 100 UNIT/ML FlexPen Inject 10 Units into the skin 3 (three) times daily with meals. Plus up to an additional 20 units 3 times daily according to sliding scale. 02/08/16   Henreitta Leber, MD  Insulin Detemir (LEVEMIR FLEXPEN) 100 UNIT/ML Pen Inject 50 Units into the skin at bedtime. 02/08/16   Henreitta Leber, MD  metFORMIN (GLUCOPHAGE) 1000 MG tablet Take 1,000-1,500 mg by mouth 2 (two) times daily with a meal. Take 1.5 tabs every morning and 1 tab every evening.    Historical Provider, MD  metoprolol (LOPRESSOR) 50 MG tablet Take 50 mg by mouth 2 (two) times daily.    Historical Provider, MD  mirtazapine (REMERON) 30 MG tablet Take 30 mg by mouth at bedtime.    Historical Provider, MD  omeprazole (PRILOSEC) 40 MG capsule Take 40 mg by mouth daily.    Historical Provider, MD  oxyCODONE-acetaminophen (PERCOCET) 10-325 MG tablet Take 1 tablet by mouth every 6 (six) hours as needed for pain. 02/08/16   Henreitta Leber, MD  pregabalin (LYRICA) 150 MG capsule Take 300 mg by mouth 2 (two) times daily.     Historical Provider, MD  risperiDONE (RISPERDAL) 0.25 MG tablet Take 0.25 mg by mouth 2 (two) times daily.    Historical Provider, MD  sertraline (ZOLOFT) 100 MG tablet Take 100 mg by mouth  daily.    Historical Provider, MD  traMADol (ULTRAM) 50 MG tablet Take 1-2 tablets (50-100 mg total) by mouth every 6 (six) hours as needed for moderate pain. 02/08/16   Henreitta Leber, MD  zolpidem (AMBIEN) 5 MG tablet Take 5 mg by mouth at bedtime as needed for sleep.    Historical Provider, MD     Family History  Problem Relation Age of Onset  . Hypertension Other     Social History   Social History  . Marital status: Divorced    Spouse name: N/A  . Number of children: N/A  . Years of education: N/A   Social History Main Topics  . Smoking status: Current Every Day Smoker    Packs/day: 0.50     Types: Cigarettes  . Smokeless tobacco: Never Used  . Alcohol use No  . Drug use: Unknown  . Sexual activity: Not Asked   Other Topics Concern  . None   Social History Narrative  . None     Review of Systems: A 12 point ROS discussed  Review of Systems  Constitutional: Positive for activity change and fatigue. Negative for appetite change, chills and fever.  HENT: Negative.   Respiratory: Negative for cough, shortness of breath and wheezing.   Cardiovascular: Negative for chest pain.  Gastrointestinal: Positive for abdominal pain. Negative for nausea and vomiting.  Genitourinary: Negative.   Musculoskeletal: Positive for back pain.  Skin: Negative.   Neurological: Negative.   Hematological: Negative.   Psychiatric/Behavioral: Negative.     Vital Signs: BP 122/75 (BP Location: Left Arm)   Pulse 75   Temp 97.1 F (36.2 C) (Oral)   Resp 18   SpO2 98%   Physical Exam  Constitutional: He is oriented to person, place, and time.  Obese, NAD. Sitting on side of the bed, appears uncomfortable, c/o back pain  HENT:  Head: Normocephalic and atraumatic.  Eyes: EOM are normal.  Neck: Normal range of motion.  Cardiovascular: Normal rate, regular rhythm and normal heart sounds.   Pulmonary/Chest: Effort normal and breath sounds normal. No respiratory distress. He has no wheezes.  Abdominal: Soft. There is tenderness.  Musculoskeletal: Normal range of motion.       Thoracic back: He exhibits tenderness and pain.       Back:  Red circle indicates tenderness/pain with palpation and an area of edema, probably c/w abscess. Blue circle indicates possible bruise/hematoma (patient reports a fall)  Neurological: He is alert and oriented to person, place, and time.  Skin: Skin is warm and dry.  Psychiatric: He has a normal mood and affect. His behavior is normal. Judgment and thought content normal.  Vitals reviewed.   Mallampati Score:  MD Evaluation Airway: WNL Heart:  WNL Abdomen: WNL Chest/ Lungs: WNL ASA  Classification: 2 Mallampati/Airway Score: Two  Imaging: Dg Chest 2 View  Result Date: 02/27/2016 CLINICAL DATA:  Increasing confusion. EXAM: CHEST  2 VIEW COMPARISON:  02/05/2016. FINDINGS: Cardiomegaly. Asymmetric opacity at the RIGHT base, with elevated hemidiaphragm, not present previously, suspicious for early pneumonia. Old healed BILATERAL rib fractures. No pneumothorax. Calcified tortuous aorta. IMPRESSION: Cardiomegaly.  RIGHT lower lobe infiltrate not excluded. Electronically Signed   By: Staci Righter M.D.   On: 02/27/2016 15:57   Dg Chest 2 View  Result Date: 02/05/2016 CLINICAL DATA:  Weakness EXAM: CHEST  2 VIEW COMPARISON:  03/10/2012 thoracic spine radiographs FINDINGS: Atherosclerotic aortic arch.  Heart size within normal limits. Thoracic spondylosis. Right posterolateral sixth rib deformities  suggesting old fracture. Old left healed rib fractures noted laterally. No pleural effusion. IMPRESSION: 1. Old bilateral healed rib fractures. Thoracic spondylosis. No acute findings. 2. Atherosclerotic calcification of the aortic arch. Electronically Signed   By: Van Clines M.D.   On: 02/05/2016 14:28   Ct Abdomen Pelvis W Contrast  Result Date: 02/27/2016 CLINICAL DATA:  Recent multiple falls, chronic abdominal pain, worse in the left upper quadrant. Confusion. EXAM: CT ABDOMEN AND PELVIS WITH CONTRAST TECHNIQUE: Multidetector CT imaging of the abdomen and pelvis was performed using the standard protocol following bolus administration of intravenous contrast. CONTRAST:  149mL ISOVUE-300 IOPAMIDOL (ISOVUE-300) INJECTION 61% COMPARISON:  02/27/2016 chest x-ray FINDINGS: Lower chest: Minor right base dependent atelectasis. Lung bases otherwise clear. No pericardial or pleural effusion. Native coronary atherosclerosis. Lower thoracic aortic atherosclerosis as well. Hepatobiliary: No focal hepatic abnormality or intrahepatic biliary dilatation.  Patent portal vein. Layering slight hyper attenuation within the gallbladder, suspicious for gallstones or sludge. Pancreas: Very thin in appearance, mildly atrophic. No focal abnormality or ductal dilatation. Spleen: Normal in size without focal abnormality. Adrenals/Urinary Tract: Normal adrenal glands. Scattered renal hypodense cysts larger on the left. Largest left renal cyst measures 28 mm, image 49. No renal obstruction or hydronephrosis. No ureteral dilatation or ureteral calculus. Bladder demonstrates a focal right posterior wall nodule/mass measuring 2.5 cm. Recommend follow-up nonemergent urologic consultation for cystoscopy evaluation. Stomach/Bowel: Negative for bowel obstruction, significant dilatation, ileus, or free air. Appendix not visualized. No fluid collection or abscess. No ascites. Vascular/Lymphatic: Extensive aortoiliac atherosclerosis without aneurysm or occlusive process. No retroperitoneal hemorrhage or hematoma. No adenopathy. Reproductive: Prostate normal in size. Seminal vesicles symmetric. Penile prosthesis noted. Other: No abdominal wall hernia or abnormality. No abdominopelvic ascites. Musculoskeletal: T11-12 surrounding paraspinous soft tissue abnormality with edema and enhancement. Endplate irregularity at T11 and T12. Appearance compatible with T11-12 spondylo discitis. There is associated inferior endplate fracture at 624THL. No severe compression deformity or focal kyphosis. By CT, there is the suggestion of a small right paraspinous fluid collection measuring 18 x 9 mm, image 22. This is concerning for paraspinous abscess. Diffuse degenerative changes of the lumbar spine with vacuum disc phenomena at L5. IMPRESSION: CT findings concerning for acute spondylo discitis at T11-12 with an associated T11 inferior endplate fracture. Question early small right paraspinous abscess at T11-12 measuring 18 x 9 mm. Consider further evaluation with MRI without and with contrast. Coronary and  diffuse thoracoabdominal aortic atherosclerosis Layering sludge versus stones in the gallbladder Incidental renal cysts 2.5 cm right posterior bladder nodular mass warrants urologic follow-up for cystoscopy. These results were called by telephone at the time of interpretation on 02/27/2016 at 5:27 pm to Dr. Eula Listen , who verbally acknowledged these results. Electronically Signed   By: Jerilynn Mages.  Shick M.D.   On: 02/27/2016 17:29    Labs:  CBC:  Recent Labs  02/05/16 1336 02/06/16 0515 02/27/16 1340 02/28/16 0125  WBC 9.4 10.7* 14.4* 9.4  HGB 13.5 13.3 12.0* 11.0*  HCT 40.3 40.4 35.1* 34.2*  PLT 328 328 478* 393    COAGS: No results for input(s): INR, APTT in the last 8760 hours.  BMP:  Recent Labs  02/07/16 0455 02/08/16 0554 02/27/16 1445 02/28/16 0125  NA 140 138 136 132*  K 3.2* 3.3* 3.1* 3.3*  CL 106 105 96* 98*  CO2 23 24 28 25   GLUCOSE 125* 134* 100* 143*  BUN 10 11 24* 18  CALCIUM 7.6* 7.7* 9.1 8.3*  CREATININE 0.94 0.92 1.03 1.01  GFRNONAA >60 >60 >60 >60  GFRAA >60 >60 >60 >60    LIVER FUNCTION TESTS:  Recent Labs  02/05/16 1336 02/27/16 1445 02/28/16 0125  BILITOT 0.5 0.7 0.7  AST 28 38 38  ALT 20 35 37  ALKPHOS 152* 257* 219*  PROT 7.4 7.5 6.0*  ALBUMIN 3.2* 2.6* 2.1*    TUMOR MARKERS: No results for input(s): AFPTM, CEA, CA199, CHROMGRNA in the last 8760 hours.  Assessment and Plan:  Back pain  CT findings concerning for acute spondylo discitis at T11-12 with an associated T11 inferior endplate fracture. Question early small right paraspinous abscess at T11-12 measuring 18 x 9 mm.   Will proceed with Image guided T 11-12 disc aspiration today by Dr. Vernard Gambles.  Risks and Benefits discussed with the patient including, but not limited to bleeding, infection, damage to adjacent structures or low yield requiring additional tests.  All of the patient's questions were answered, patient is agreeable to proceed. Consent signed and in  chart.  Thank you for this interesting consult.  I greatly enjoyed meeting Dennis Haynes and look forward to participating in their care.  A copy of this report was sent to the requesting provider on this date.  Electronically Signed: Murrell Redden PA-C 02/28/2016, 9:15 AM   I spent a total of 40 Minutes in face to face in clinical consultation, greater than 50% of which was counseling/coordinating care for disc aspiration.

## 2016-02-28 NOTE — Sedation Documentation (Signed)
Escort to MRI via bed

## 2016-02-28 NOTE — Procedures (Signed)
Thoracic T11-12 disc aspiration under fluoro 82ml purulent sent for GS, C&S No complication No blood loss. See complete dictation in [facility].

## 2016-02-28 NOTE — H&P (Signed)
History and Physical    Dennis Haynes [id number] DOB: 1940-07-08 DOA: 02/27/2016  PCP: Marygrace Drought, MD  Patient coming from: Patient was transferred from [facility]. Concerns for right paraspinous abscess around the T11.  Chief Complaint: Right-sided back and abdominal pain.  HPI: Dennis Haynes is a 75 y.o. male with diabetes mellitus type 2, hypertension, chronic back pain, hyperlipidemia who was admitted 3 weeks ago at [facility] for dehydration and UTI and was eventually discharged to rehabilitation but patient left rehabilitation by his own will, presents to the ER because of worsening pain over the last 10 days mostly involving his right lower back and right side of his lower abdomen. Pain worsens on movement. Denies any nausea vomiting diarrhea dysuria. CT of the abdomen and pelvis done shows -    CT findings concerning for acute spondylo discitis at T11-12 with an associated T11 inferior endplate fracture. Question early small right paraspinous abscess at T11-12 measuring 18 x 9 mm. Consider further evaluation with MRI without and with contrast.  On-call neurosurgeon Dr. Ellene Route, at [facility] was contacted and was transferred to [facility]. As per Dr. Lyla Glassing with the ER physician discussed had recommended IV antibiotics and IR consult for aspiration of the possible infective source.  In addition patient's UA shows possible UTI and CT also showed a possible bladder mass. Chest x-ray shows possible infiltrates but patient denies any productive cough or shortness of breath.  Patient denies any incontinence of urine or bowel. Patient states he has not moved his bowels for last few days. Patient also stated that he has chronic right lower extremity weakness.  ED Course: Blood cultures were drawn.  Review of Systems: As per HPI, rest all negative.   Past Medical History:  Diagnosis Date  . Anxiety   . Chronic back pain   . Dementia   .  Diabetes ([place])   . Hypertension     Past Surgical History:  Procedure Laterality Date  . APPENDECTOMY       reports that he has been smoking Cigarettes.  He has been smoking about 0.50 packs per day. He has never used smokeless tobacco. He reports that he does not drink alcohol. His drug history is not on file.  No Known Allergies  Family History  Problem Relation Age of Onset  . Hypertension Other     Prior to Admission medications   Medication Sig Start Date End Date Taking? Authorizing Provider  ALPRAZolam Duanne Moron) 0.5 MG tablet Take 1 tablet (0.5 mg total) by mouth 3 (three) times daily as needed for anxiety. 02/08/16   Henreitta Leber, MD  aspirin EC 81 MG tablet Take 1 tablet by mouth daily.    Historical Provider, MD  atorvastatin (LIPITOR) 80 MG tablet Take 80 mg by mouth daily.    Historical Provider, MD  hydrochlorothiazide (HYDRODIURIL) 25 MG tablet Take 25 mg by mouth daily.    Historical Provider, MD  insulin aspart (NOVOLOG) 100 UNIT/ML FlexPen Inject 10 Units into the skin 3 (three) times daily with meals. Plus up to an additional 20 units 3 times daily according to sliding scale. 02/08/16   Henreitta Leber, MD  Insulin Detemir (LEVEMIR FLEXPEN) 100 UNIT/ML Pen Inject 50 Units into the skin at bedtime. 02/08/16   Henreitta Leber, MD  metFORMIN (GLUCOPHAGE) 1000 MG tablet Take 1,000-1,500 mg by mouth 2 (two) times daily with a meal. Take 1.5 tabs every morning and 1 tab every evening.  Historical Provider, MD  metoprolol (LOPRESSOR) 50 MG tablet Take 50 mg by mouth 2 (two) times daily.    Historical Provider, MD  mirtazapine (REMERON) 30 MG tablet Take 30 mg by mouth at bedtime.    Historical Provider, MD  omeprazole (PRILOSEC) 40 MG capsule Take 40 mg by mouth daily.    Historical Provider, MD  oxyCODONE-acetaminophen (PERCOCET) 10-325 MG tablet Take 1 tablet by mouth every 6 (six) hours as needed for pain. 02/08/16   Henreitta Leber, MD  pregabalin (LYRICA) 150 MG capsule  Take 300 mg by mouth 2 (two) times daily.     Historical Provider, MD  risperiDONE (RISPERDAL) 0.25 MG tablet Take 0.25 mg by mouth 2 (two) times daily.    Historical Provider, MD  sertraline (ZOLOFT) 100 MG tablet Take 100 mg by mouth daily.    Historical Provider, MD  traMADol (ULTRAM) 50 MG tablet Take 1-2 tablets (50-100 mg total) by mouth every 6 (six) hours as needed for moderate pain. 02/08/16   Henreitta Leber, MD  zolpidem (AMBIEN) 5 MG tablet Take 5 mg by mouth at bedtime as needed for sleep.    Historical Provider, MD    Physical Exam: Vitals:   02/27/16 2204  BP: (!) 120/101  Pulse: 87  Resp: 18  Temp: 97.9 F (36.6 C)  TempSrc: Oral  SpO2: 98%      Constitutional: Pain on moving otherwise not in distress. Vitals:   02/27/16 2204  BP: (!) 120/101  Pulse: 87  Resp: 18  Temp: 97.9 F (36.6 C)  TempSrc: Oral  SpO2: 98%   Eyes: Anicteric no pallor. ENMT: No discharge from the ears eyes nose or mouth. Neck: No mass felt. No JVD appreciated. Respiratory: No rhonchi or crepitations. Cardiovascular: S1-S2 heard. Abdomen: Soft non-tender. Bowel sounds present. Musculoskeletal: Tenderness in the right back area. Skin: No rash. Neurologic: Alert awake oriented to time place and person. Moves all extremities. Patient states he has chronic right lower extremity weakness. Psychiatric: Appears normal.   Labs on Admission: I have personally reviewed following labs and imaging studies  CBC:  Recent Labs Lab 02/27/16 1340  WBC 14.4*  HGB 12.0*  HCT 35.1*  MCV 71.4*  PLT 123456*   Basic Metabolic Panel:  Recent Labs Lab 02/27/16 1445  NA 136  K 3.1*  CL 96*  CO2 28  GLUCOSE 100*  BUN 24*  CREATININE 1.03  CALCIUM 9.1   GFR: Estimated Creatinine Clearance: 63 mL/min (by C-G formula based on SCr of 1.03 mg/dL). Liver Function Tests:  Recent Labs Lab 02/27/16 1445  AST 38  ALT 35  ALKPHOS 257*  BILITOT 0.7  PROT 7.5  ALBUMIN 2.6*    Recent  Labs Lab 02/27/16 1445  LIPASE 18   No results for input(s): AMMONIA in the last 168 hours. Coagulation Profile: No results for input(s): INR, PROTIME in the last 168 hours. Cardiac Enzymes:  Recent Labs Lab 02/27/16 1445  TROPONINI <0.03   BNP (last 3 results) No results for input(s): PROBNP in the last 8760 hours. HbA1C: No results for input(s): HGBA1C in the last 72 hours. CBG: No results for input(s): GLUCAP in the last 168 hours. Lipid Profile: No results for input(s): CHOL, HDL, LDLCALC, TRIG, CHOLHDL, LDLDIRECT in the last 72 hours. Thyroid Function Tests: No results for input(s): TSH, T4TOTAL, FREET4, T3FREE, THYROIDAB in the last 72 hours. Anemia Panel: No results for input(s): VITAMINB12, FOLATE, FERRITIN, TIBC, IRON, RETICCTPCT in the last 72 hours. Urine analysis:  Component Value Date/Time   COLORURINE YELLOW (A) 02/27/2016 1840   APPEARANCEUR CLEAR (A) 02/27/2016 1840   APPEARANCEUR Clear 06/28/2013 1252   LABSPEC 1.043 (H) 02/27/2016 1840   LABSPEC 1.035 06/28/2013 1252   PHURINE 5.0 02/27/2016 1840   GLUCOSEU NEGATIVE 02/27/2016 1840   GLUCOSEU >=500 06/28/2013 1252   HGBUR 2+ (A) 02/27/2016 1840   BILIRUBINUR NEGATIVE 02/27/2016 1840   BILIRUBINUR Negative 06/28/2013 1252   KETONESUR TRACE (A) 02/27/2016 1840   PROTEINUR 100 (A) 02/27/2016 1840   NITRITE NEGATIVE 02/27/2016 1840   LEUKOCYTESUR TRACE (A) 02/27/2016 1840   LEUKOCYTESUR Negative 06/28/2013 1252   Sepsis Labs: @LABRCNTIP (procalcitonin:4,lacticidven:4) )No results found for this or any previous visit (from the past 240 hour(s)).   Radiological Exams on Admission: Dg Chest 2 View  Result Date: 02/27/2016 CLINICAL DATA:  Increasing confusion. EXAM: CHEST  2 VIEW COMPARISON:  02/05/2016. FINDINGS: Cardiomegaly. Asymmetric opacity at the RIGHT base, with elevated hemidiaphragm, not present previously, suspicious for early pneumonia. Old healed BILATERAL rib fractures. No pneumothorax.  Calcified tortuous aorta. IMPRESSION: Cardiomegaly.  RIGHT lower lobe infiltrate not excluded. Electronically Signed   By: Staci Righter M.D.   On: 02/27/2016 15:57   Ct Abdomen Pelvis W Contrast  Result Date: 02/27/2016 CLINICAL DATA:  Recent multiple falls, chronic abdominal pain, worse in the left upper quadrant. Confusion. EXAM: CT ABDOMEN AND PELVIS WITH CONTRAST TECHNIQUE: Multidetector CT imaging of the abdomen and pelvis was performed using the standard protocol following bolus administration of intravenous contrast. CONTRAST:  171mL ISOVUE-300 IOPAMIDOL (ISOVUE-300) INJECTION 61% COMPARISON:  02/27/2016 chest x-ray FINDINGS: Lower chest: Minor right base dependent atelectasis. Lung bases otherwise clear. No pericardial or pleural effusion. Native coronary atherosclerosis. Lower thoracic aortic atherosclerosis as well. Hepatobiliary: No focal hepatic abnormality or intrahepatic biliary dilatation. Patent portal vein. Layering slight hyper attenuation within the gallbladder, suspicious for gallstones or sludge. Pancreas: Very thin in appearance, mildly atrophic. No focal abnormality or ductal dilatation. Spleen: Normal in size without focal abnormality. Adrenals/Urinary Tract: Normal adrenal glands. Scattered renal hypodense cysts larger on the left. Largest left renal cyst measures 28 mm, image 49. No renal obstruction or hydronephrosis. No ureteral dilatation or ureteral calculus. Bladder demonstrates a focal right posterior wall nodule/mass measuring 2.5 cm. Recommend follow-up nonemergent urologic consultation for cystoscopy evaluation. Stomach/Bowel: Negative for bowel obstruction, significant dilatation, ileus, or free air. Appendix not visualized. No fluid collection or abscess. No ascites. Vascular/Lymphatic: Extensive aortoiliac atherosclerosis without aneurysm or occlusive process. No retroperitoneal hemorrhage or hematoma. No adenopathy. Reproductive: Prostate normal in size. Seminal vesicles  symmetric. Penile prosthesis noted. Other: No abdominal wall hernia or abnormality. No abdominopelvic ascites. Musculoskeletal: T11-12 surrounding paraspinous soft tissue abnormality with edema and enhancement. Endplate irregularity at T11 and T12. Appearance compatible with T11-12 spondylo discitis. There is associated inferior endplate fracture at 624THL. No severe compression deformity or focal kyphosis. By CT, there is the suggestion of a small right paraspinous fluid collection measuring 18 x 9 mm, image 22. This is concerning for paraspinous abscess. Diffuse degenerative changes of the lumbar spine with vacuum disc phenomena at L5. IMPRESSION: CT findings concerning for acute spondylo discitis at T11-12 with an associated T11 inferior endplate fracture. Question early small right paraspinous abscess at T11-12 measuring 18 x 9 mm. Consider further evaluation with MRI without and with contrast. Coronary and diffuse thoracoabdominal aortic atherosclerosis Layering sludge versus stones in the gallbladder Incidental renal cysts 2.5 cm right posterior bladder nodular mass warrants urologic follow-up for cystoscopy. These results were  called by telephone at the time of interpretation on 02/27/2016 at 5:27 pm to Dr. Eula Listen , who verbally acknowledged these results. Electronically Signed   By: Jerilynn Mages.  Shick M.D.   On: 02/27/2016 17:29    EKG: Independently reviewed. Normal sinus rhythm.  Assessment/Plan Principal Problem:   Back pain Active Problems:   Diabetes mellitus type 2, controlled ([place])   T11 vertebral fracture ([facility])   Discitis of thoracic region   Essential hypertension   Discitis    1. Severe back pain and right lower quadrant abdominal pain possibly from developing right paraspinous abscess and T11/12 discitis and endplate fracture - blood cultures obtained and patient is on empiric antibiotics. MRI with and without contrast of the spine has been ordered. Dr. Ellene Route on call neurosurgeon  has advised interventional radiologist for aspiration. 2. Diabetes mellitus type 2 - continue Levemir with sliding scale coverage. Will hold oral hypoglycemics. 3. Possible UTI with bladder tumor per CAT scan - will need urology follow-up for the bladder tumor. Is on empiric antibiotics. Follow urine cultures. 4. Possible pneumonia - no definite signs of pneumonia clinically. Patient is on empiric antibiotics. 5. Hypertension on metoprolol. Hold HCTZ for now as patient is receiving hydration. 6. Chronic anemia - follow CBC. 7. Hyperlipidemia on statins.   DVT prophylaxis: SCDs. Code Status: Full code.  Family Communication: Discussed with patient.  Disposition Plan: To be determined.  Consults called: IR consult.  Admission status: Inpatient. Likely stay 3-4 days. May need rehabilitation.    Rise Patience MD [facility] Pager [phone].  If 7PM-7AM, please contact night-coverage [email or web address] Password [email or web address]  02/28/2016, 1:03 AM

## 2016-02-28 NOTE — Progress Notes (Signed)
[facility] PROGRESS NOTE  Dennis Haynes [id number] DOB: [birth year] DOA: 02/27/2016  PCP: Marygrace Drought, MD  Brief History/Interval Summary: 75 year old Caucasian male with a past medical history of diabetes mellitus type 2, essential hypertension, chronic back pain, hyperlipidemia was entered with complaints of worsening back pain. Patient also was complaining of abdominal pain. He underwent a CT scan of the abdomen and pelvis. This was done at [facility]. CT scan revealed findings suggestive of acute discitis at T11-12 with an associated T11 inferior endplate fracture. Question small early right paraspinous abscess at T11/12 measuring 18 x 9 mm. Patient was hospitalized for further management.  Reason for Visit: Acute discitis with abscess  Consultants: Phone discussion with neurosurgery, Dr. Ellene Route. Interventional radiology.  Procedures: None yet  Antibiotics: Vancomycin and Zosyn 9/20  Subjective/Interval History: Patient complains of severe pain in the mid-lower back. It's 10/10 in intensity. Some radiation down to his legs. Denies any nausea, vomiting.  ROS: No chest pain or shortness of breath.  Objective:  Vital Signs  Vitals:   02/27/16 2204 02/28/16 0500  BP: (!) 120/101 122/75  Pulse: 87 75  Resp: 18 18  Temp: 97.9 F (36.6 C) 97.1 F (36.2 C)  TempSrc: Oral Oral  SpO2: 98% 98%    Intake/Output Summary (Last 24 hours) at 02/28/16 1255 Last data filed at 02/28/16 1000  Gross per 24 hour  Intake              495 ml  Output              750 ml  Net             -255 ml   There were no vitals filed for this visit.  General appearance: alert, cooperative, appears stated age and In moderate discomfort Resp: clear to auscultation bilaterally Cardio: regular rate and rhythm, S1, S2 normal, no murmur, click, rub or gallop GI: soft, non-tender; bowel sounds normal; no masses,  no organomegaly Extremities: extremities normal, atraumatic, no  cyanosis or edema Back: No obvious deformity noted. Pulses: 2+ and symmetric Neurologic: Awake and alert. Oriented 3. No cranial nerve deficits. Modest and is equal bilateral upper extremities. Able to move both his lower extremities. Not able to fully cooperate with examination due to significant pain.  Lab Results:  Data Reviewed: I have personally reviewed following labs and imaging studies  CBC:  Recent Labs Lab 02/27/16 1340 02/28/16 0125  WBC 14.4* 9.4  NEUTROABS  --  6.7  HGB 12.0* 11.0*  HCT 35.1* 34.2*  MCV 71.4* 74.7*  PLT 478* [id number]    Basic Metabolic Panel:  Recent Labs Lab 02/27/16 1445 02/28/16 0125  NA 136 132*  K 3.1* 3.3*  CL 96* 98*  CO2 28 25  GLUCOSE 100* 143*  BUN 24* 18  CREATININE 1.03 1.01  CALCIUM 9.1 8.3*    GFR: Estimated Creatinine Clearance: 64.2 mL/min (by C-G formula based on SCr of 1.01 mg/dL).  Liver Function Tests:  Recent Labs Lab 02/27/16 1445 02/28/16 0125  AST 38 38  ALT 35 37  ALKPHOS 257* 219*  BILITOT 0.7 0.7  PROT 7.5 6.0*  ALBUMIN 2.6* 2.1*     Recent Labs Lab 02/27/16 1445  LIPASE 18   Coagulation Profile:  Recent Labs Lab 02/28/16 0932  INR 1.17    Cardiac Enzymes:  Recent Labs Lab 02/27/16 1445  TROPONINI <0.03    CBG:  Recent Labs Lab 02/28/16 0126 02/28/16 0628 02/28/16 1133  GLUCAP 143*  142* 125*     No results found for this or any previous visit (from the past 240 hour(s)).    Radiology Studies: Dg Chest 2 View  Result Date: 02/27/2016 CLINICAL DATA:  Increasing confusion. EXAM: CHEST  2 VIEW COMPARISON:  02/05/2016. FINDINGS: Cardiomegaly. Asymmetric opacity at the RIGHT base, with elevated hemidiaphragm, not present previously, suspicious for early pneumonia. Old healed BILATERAL rib fractures. No pneumothorax. Calcified tortuous aorta. IMPRESSION: Cardiomegaly.  RIGHT lower lobe infiltrate not excluded. Electronically Signed   By: Staci Righter M.D.   On: 02/27/2016  15:57   Ct Abdomen Pelvis W Contrast  Result Date: 02/27/2016 CLINICAL DATA:  Recent multiple falls, chronic abdominal pain, worse in the left upper quadrant. Confusion. EXAM: CT ABDOMEN AND PELVIS WITH CONTRAST TECHNIQUE: Multidetector CT imaging of the abdomen and pelvis was performed using the standard protocol following bolus administration of intravenous contrast. CONTRAST:  178mL ISOVUE-300 IOPAMIDOL (ISOVUE-300) INJECTION 61% COMPARISON:  02/27/2016 chest x-ray FINDINGS: Lower chest: Minor right base dependent atelectasis. Lung bases otherwise clear. No pericardial or pleural effusion. Native coronary atherosclerosis. Lower thoracic aortic atherosclerosis as well. Hepatobiliary: No focal hepatic abnormality or intrahepatic biliary dilatation. Patent portal vein. Layering slight hyper attenuation within the gallbladder, suspicious for gallstones or sludge. Pancreas: Very thin in appearance, mildly atrophic. No focal abnormality or ductal dilatation. Spleen: Normal in size without focal abnormality. Adrenals/Urinary Tract: Normal adrenal glands. Scattered renal hypodense cysts larger on the left. Largest left renal cyst measures 28 mm, image 49. No renal obstruction or hydronephrosis. No ureteral dilatation or ureteral calculus. Bladder demonstrates a focal right posterior wall nodule/mass measuring 2.5 cm. Recommend follow-up nonemergent urologic consultation for cystoscopy evaluation. Stomach/Bowel: Negative for bowel obstruction, significant dilatation, ileus, or free air. Appendix not visualized. No fluid collection or abscess. No ascites. Vascular/Lymphatic: Extensive aortoiliac atherosclerosis without aneurysm or occlusive process. No retroperitoneal hemorrhage or hematoma. No adenopathy. Reproductive: Prostate normal in size. Seminal vesicles symmetric. Penile prosthesis noted. Other: No abdominal wall hernia or abnormality. No abdominopelvic ascites. Musculoskeletal: T11-12 surrounding paraspinous  soft tissue abnormality with edema and enhancement. Endplate irregularity at T11 and T12. Appearance compatible with T11-12 spondylo discitis. There is associated inferior endplate fracture at 624THL. No severe compression deformity or focal kyphosis. By CT, there is the suggestion of a small right paraspinous fluid collection measuring 18 x 9 mm, image 22. This is concerning for paraspinous abscess. Diffuse degenerative changes of the lumbar spine with vacuum disc phenomena at L5. IMPRESSION: CT findings concerning for acute spondylo discitis at T11-12 with an associated T11 inferior endplate fracture. Question early small right paraspinous abscess at T11-12 measuring 18 x 9 mm. Consider further evaluation with MRI without and with contrast. Coronary and diffuse thoracoabdominal aortic atherosclerosis Layering sludge versus stones in the gallbladder Incidental renal cysts 2.5 cm right posterior bladder nodular mass warrants urologic follow-up for cystoscopy. These results were called by telephone at the time of interpretation on 02/27/2016 at 5:27 pm to Dr. Eula Listen , who verbally acknowledged these results. Electronically Signed   By: Jerilynn Mages.  Shick M.D.   On: 02/27/2016 17:29     Medications:  Scheduled: . aspirin EC  81 mg Oral Daily  . atorvastatin  80 mg Oral q1800  . insulin aspart  0-9 Units Subcutaneous TID WC  . insulin aspart  10 Units Subcutaneous TID WC  . insulin detemir  25 Units Subcutaneous QHS  . metoprolol  50 mg Oral BID  . mirtazapine  30 mg Oral QHS  .  pantoprazole  40 mg Oral Daily  . piperacillin-tazobactam (ZOSYN)  IV  3.375 g Intravenous Q8H  . potassium chloride  10 mEq Intravenous Q1 Hr x 4  . pregabalin  300 mg Oral BID  . risperiDONE  0.25 mg Oral BID  . senna  1 tablet Oral Daily  . sertraline  100 mg Oral Daily  . vancomycin  750 mg Intravenous Q12H   Continuous: . sodium chloride 75 mL/hr at 02/28/16 0158   [id number] **OR** acetaminophen,  ALPRAZolam, morphine injection, ondansetron **OR** ondansetron (ZOFRAN) IV, oxyCODONE-acetaminophen **AND** oxyCODONE, zolpidem  Assessment/Plan:  Principal Problem:   Back pain Active Problems:   Diabetes mellitus type 2, controlled ([place])   T11 vertebral fracture ([facility])   Discitis of thoracic region   Essential hypertension   Discitis    Acute T11-12 discitis with paraspinous abscess resulting in severe back pain  Blood cultures obtained and patient is on empiric antibiotics. MRI with and without contrast of the spine has been ordered. Dr. Ellene Route on call neurosurgeon has advised interventional radiologist for aspiration. Await IR input.  Diabetes mellitus type 2 Continue Levemir with sliding scale coverage. Will hold oral hypoglycemics. Decreased dose of Levemir.  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 Antibiotics prescribed for above-mentioned reason, should cover. Patient will need outpatient urological follow-up for the bladder tumor.   Possible pneumonia No definite signs of pneumonia clinically. Patient is on empiric antibiotics.  Essential Hypertension Continue metoprolol. Hold HCTZ for now.  Hypokalemia Will be repleted  Chronic anemia Continue to monitor hemoglobin. No evidence for overt bleeding.  Hyperlipidemia On statins  DVT Prophylaxis: SCDs    Code Status: Full code  Family Communication: Discussed with the patient  Disposition Plan: Await aspiration of the abscess. Interventional radiology. Continue management as outlined above.    LOS: 1 day   [facility] Pager [phone] 02/28/2016, 12:55 PM  If 7PM-7AM, please contact night-coverage at [email or web address], password [facility]

## 2016-02-29 ENCOUNTER — Encounter (HOSPITAL_COMMUNITY): Payer: Self-pay | Admitting: General Practice

## 2016-02-29 DIAGNOSIS — I10 Essential (primary) hypertension: Secondary | ICD-10-CM

## 2016-02-29 DIAGNOSIS — M546 Pain in thoracic spine: Secondary | ICD-10-CM

## 2016-02-29 DIAGNOSIS — E119 Type 2 diabetes mellitus without complications: Secondary | ICD-10-CM

## 2016-02-29 LAB — GLUCOSE, CAPILLARY
GLUCOSE-CAPILLARY: 173 mg/dL — AB (ref 65–99)
GLUCOSE-CAPILLARY: 54 mg/dL — AB (ref 65–99)
GLUCOSE-CAPILLARY: 81 mg/dL (ref 65–99)
GLUCOSE-CAPILLARY: 210 mg/dL — AB (ref 65–99)
Glucose-Capillary: 88 mg/dL (ref 65–99)

## 2016-02-29 LAB — BASIC METABOLIC PANEL
Anion gap: 11 (ref 5–15)
BUN: 12 mg/dL (ref 6–20)
CHLORIDE: 99 mmol/L — AB (ref 101–111)
CO2: 26 mmol/L (ref 22–32)
Calcium: 8.8 mg/dL — ABNORMAL LOW (ref 8.9–10.3)
Creatinine, Ser: 1 mg/dL (ref 0.61–1.24)
GLUCOSE: 125 mg/dL — AB (ref 65–99)
POTASSIUM: 4.1 mmol/L (ref 3.5–5.1)
SODIUM: 136 mmol/L (ref 135–145)

## 2016-02-29 LAB — CBC
HCT: 35.5 % — ABNORMAL LOW (ref 39.0–52.0)
Hemoglobin: 11.1 g/dL — ABNORMAL LOW (ref 13.0–17.0)
MCH: 23.5 pg — ABNORMAL LOW (ref 26.0–34.0)
MCHC: 31.3 g/dL (ref 30.0–36.0)
MCV: 75.1 fL — ABNORMAL LOW (ref 78.0–100.0)
Platelets: 436 10*3/uL — ABNORMAL HIGH (ref 150–400)
RBC: 4.73 MIL/uL (ref 4.22–5.81)
RDW: 16.9 % — AB (ref 11.5–15.5)
WBC: 10.3 10*3/uL (ref 4.0–10.5)

## 2016-02-29 LAB — MAGNESIUM: Magnesium: 1.3 mg/dL — ABNORMAL LOW (ref 1.7–2.4)

## 2016-02-29 NOTE — Progress Notes (Signed)
[facility] PROGRESS NOTE  Dennis Haynes [id number] DOB: 08-07-1940 DOA: 02/27/2016  PCP: Marygrace Drought, MD  Brief History/Interval Summary: 75 year old Caucasian male with a past medical history of diabetes mellitus type 2, essential hypertension, chronic back pain, hyperlipidemia was entered with complaints of worsening back pain. Patient also was complaining of abdominal pain. He underwent a CT scan of the abdomen and pelvis. This was done at [facility]. CT scan revealed findings suggestive of acute discitis at T11-12 with an associated T11 inferior endplate fracture. Question small early right paraspinous abscess at T11/12 measuring 18 x 9 mm. Patient was hospitalized for further management.  Reason for Visit: Acute discitis with abscess  Consultants: Phone discussion with neurosurgery, Dr. Ellene Route. Interventional radiology.  Procedures: Disc aspiration by IR 9/20  Antibiotics: Vancomycin and Zosyn 9/20  Subjective/Interval History: Patient feels better. Pain is well controlled this morning. He was able to sleep last night. Denies any nausea, vomiting.  ROS: No chest pain or shortness of breath.  Objective:  Vital Signs  Vitals:   02/28/16 1458 02/28/16 1500 02/28/16 2047 02/29/16 0701  BP:  (!) 152/88 124/67 (!) 94/45  Pulse: 73 75 88 75  Resp:  15 16 16   Temp:  97.1 F (36.2 C) 98.3 F (36.8 C) 98.8 F (37.1 C)  TempSrc:  Oral Oral Oral  SpO2: 95% 98% 100% 94%    Intake/Output Summary (Last 24 hours) at 02/29/16 0856 Last data filed at 02/29/16 [id number]  Gross per 24 hour  Intake                0 ml  Output             1325 ml  Net            -1325 ml    General appearance: alert, cooperative, appears stated age , In no distress Resp: clear to auscultation bilaterally Cardio: regular rate and rhythm, S1, S2 normal, no murmur, click, rub or gallop GI: soft, non-tender; bowel sounds normal; no masses,  no organomegaly Neurologic: Awake and  alert. Oriented 3. No cranial nerve deficits. Strength is equal bilateral upper extremities. Able to move both his lower extremities.   Lab Results:  Data Reviewed: I have personally reviewed following labs and imaging studies  CBC:  Recent Labs Lab 02/27/16 1340 02/28/16 0125 02/29/16 0522  WBC 14.4* 9.4 10.3  NEUTROABS  --  6.7  --   HGB 12.0* 11.0* 11.1*  HCT 35.1* 34.2* 35.5*  MCV 71.4* 74.7* 75.1*  PLT 478* 393 436*    Basic Metabolic Panel:  Recent Labs Lab 02/27/16 1445 02/28/16 0125 02/29/16 0522  NA 136 132* 136  K 3.1* 3.3* 4.1  CL 96* 98* 99*  CO2 28 25 26   GLUCOSE 100* 143* 125*  BUN 24* 18 12  CREATININE 1.03 1.01 1.00  CALCIUM 9.1 8.3* 8.8*  MG  --   --  1.3*    GFR: Estimated Creatinine Clearance: 64.9 mL/min (by C-G formula based on SCr of 1 mg/dL).  Liver Function Tests:  Recent Labs Lab 02/27/16 1445 02/28/16 0125  AST 38 38  ALT 35 37  ALKPHOS 257* 219*  BILITOT 0.7 0.7  PROT 7.5 6.0*  ALBUMIN 2.6* 2.1*     Recent Labs Lab 02/27/16 1445  LIPASE 18   Coagulation Profile:  Recent Labs Lab 02/28/16 0932  INR 1.17    Cardiac Enzymes:  Recent Labs Lab 02/27/16 1445  TROPONINI <0.03    CBG:  Recent Labs Lab 02/28/16 0628 02/28/16 1133 02/28/16 1629 02/28/16 2044 02/29/16 0706  GLUCAP 142* 125* 119* 235* 173*     Recent Results (from the past 240 hour(s))  Urine culture     Status: None (Preliminary result)   Collection Time: 02/27/16  6:40 PM  Result Value Ref Range Status   Specimen Description URINE, CLEAN CATCH  Final   Special Requests NONE  Final   Culture   Final    CULTURE REINCUBATED FOR BETTER GROWTH Performed at [facility]    Report Status PENDING  Incomplete  Aerobic/Anaerobic Culture (surgical/deep wound)     Status: None (Preliminary result)   Collection Time: 02/28/16  2:41 PM  Result Value Ref Range Status   Specimen Description WOUND ASPIRATE  Final   Special Requests DISC  T11/T12  Final   Gram Stain   Final    FEW WBC PRESENT, PREDOMINANTLY PMN RARE GRAM POSITIVE COCCI IN PAIRS IN SINGLES    Culture PENDING  Incomplete   Report Status PENDING  Incomplete      Radiology Studies: Dg Chest 2 View  Result Date: 02/27/2016 CLINICAL DATA:  Increasing confusion. EXAM: CHEST  2 VIEW COMPARISON:  02/05/2016. FINDINGS: Cardiomegaly. Asymmetric opacity at the RIGHT base, with elevated hemidiaphragm, not present previously, suspicious for early pneumonia. Old healed BILATERAL rib fractures. No pneumothorax. Calcified tortuous aorta. IMPRESSION: Cardiomegaly.  RIGHT lower lobe infiltrate not excluded. Electronically Signed   By: Staci Righter M.D.   On: 02/27/2016 15:57   Ct Abdomen Pelvis W Contrast  Result Date: 02/27/2016 CLINICAL DATA:  Recent multiple falls, chronic abdominal pain, worse in the left upper quadrant. Confusion. EXAM: CT ABDOMEN AND PELVIS WITH CONTRAST TECHNIQUE: Multidetector CT imaging of the abdomen and pelvis was performed using the standard protocol following bolus administration of intravenous contrast. CONTRAST:  181mL ISOVUE-300 IOPAMIDOL (ISOVUE-300) INJECTION 61% COMPARISON:  02/27/2016 chest x-ray FINDINGS: Lower chest: Minor right base dependent atelectasis. Lung bases otherwise clear. No pericardial or pleural effusion. Native coronary atherosclerosis. Lower thoracic aortic atherosclerosis as well. Hepatobiliary: No focal hepatic abnormality or intrahepatic biliary dilatation. Patent portal vein. Layering slight hyper attenuation within the gallbladder, suspicious for gallstones or sludge. Pancreas: Very thin in appearance, mildly atrophic. No focal abnormality or ductal dilatation. Spleen: Normal in size without focal abnormality. Adrenals/Urinary Tract: Normal adrenal glands. Scattered renal hypodense cysts larger on the left. Largest left renal cyst measures 28 mm, image 49. No renal obstruction or hydronephrosis. No ureteral dilatation or  ureteral calculus. Bladder demonstrates a focal right posterior wall nodule/mass measuring 2.5 cm. Recommend follow-up nonemergent urologic consultation for cystoscopy evaluation. Stomach/Bowel: Negative for bowel obstruction, significant dilatation, ileus, or free air. Appendix not visualized. No fluid collection or abscess. No ascites. Vascular/Lymphatic: Extensive aortoiliac atherosclerosis without aneurysm or occlusive process. No retroperitoneal hemorrhage or hematoma. No adenopathy. Reproductive: Prostate normal in size. Seminal vesicles symmetric. Penile prosthesis noted. Other: No abdominal wall hernia or abnormality. No abdominopelvic ascites. Musculoskeletal: T11-12 surrounding paraspinous soft tissue abnormality with edema and enhancement. Endplate irregularity at T11 and T12. Appearance compatible with T11-12 spondylo discitis. There is associated inferior endplate fracture at 624THL. No severe compression deformity or focal kyphosis. By CT, there is the suggestion of a small right paraspinous fluid collection measuring 18 x 9 mm, image 22. This is concerning for paraspinous abscess. Diffuse degenerative changes of the lumbar spine with vacuum disc phenomena at L5. IMPRESSION: CT findings concerning for acute spondylo discitis at T11-12 with an associated T11 inferior  endplate fracture. Question early small right paraspinous abscess at T11-12 measuring 18 x 9 mm. Consider further evaluation with MRI without and with contrast. Coronary and diffuse thoracoabdominal aortic atherosclerosis Layering sludge versus stones in the gallbladder Incidental renal cysts 2.5 cm right posterior bladder nodular mass warrants urologic follow-up for cystoscopy. These results were called by telephone at the time of interpretation on 02/27/2016 at 5:27 pm to Dr. Eula Listen , who verbally acknowledged these results. Electronically Signed   By: Jerilynn Mages.  Shick M.D.   On: 02/27/2016 17:29   Ir Fluoro Guide Ndl Plmt /  Bx  Result Date: 02/28/2016 INDICATION: Severe back pain.  CT suggests discitis/osteomyelitis T11-12. EXAM: IR FLUORO GUIDE NEEDLE PLACEMENT /BIOPSY MEDICATIONS: Lidocaine 1% subcutaneous 10 mL ANESTHESIA/SEDATION: Intravenous Fentanyl and Versed were administered as conscious sedation during continuous monitoring of the patient's level of consciousness and physiological / cardiorespiratory status by the radiology RN, with a total moderate sedation time of 20 minutes. PROCEDURE: Informed written consent was obtained from the patient after a thorough discussion of the procedural risks, benefits and alternatives. All questions were addressed. Maximal Sterile Barrier Technique was utilized including caps, mask, sterile gowns, sterile gloves, sterile drape, hand hygiene and skin antiseptic. A timeout was performed prior to the initiation of the procedure. The T11-12 disc was localized and correlated to previous imaging. An appropriate skin entry site was determined and marked. Site was marked, prepped with Betadine, draped in usual sterile fashion, infiltrated locally with 1% lidocaine. Under intermittent fluoroscopic guidance, a 16 gauge trocar needle was advanced into the interspace from a left parasagittal approach. Needle tip position confirmed in 3 projections. 5 mL of purulent material were aspirated, sent for Gram stain, culture and sensitivity. The needle was removed. The patient tolerated the procedure well. FLUOROSCOPY TIME:  30 seconds, 15 mGy COMPLICATIONS: None immediate. IMPRESSION: 1. Technically successful thoracic T11-12 disc aspiration under fluoroscopy, returning 5 mL purulent fluid, sent for Gram stain, culture and sensitivity. Electronically Signed   By: Lucrezia Europe M.D.   On: 02/28/2016 14:58     Medications:  Scheduled: . aspirin EC  81 mg Oral Daily  . atorvastatin  80 mg Oral q1800  . insulin aspart  0-9 Units Subcutaneous TID WC  . insulin aspart  10 Units Subcutaneous TID WC  .  insulin detemir  25 Units Subcutaneous QHS  . metoprolol  50 mg Oral BID  . mirtazapine  30 mg Oral QHS  . pantoprazole  40 mg Oral Daily  . piperacillin-tazobactam (ZOSYN)  IV  3.375 g Intravenous Q8H  . pregabalin  300 mg Oral BID  . risperiDONE  0.25 mg Oral BID  . senna  1 tablet Oral Daily  . sertraline  100 mg Oral Daily  . vancomycin  750 mg Intravenous Q12H   Continuous:   [id number] **OR** acetaminophen, ALPRAZolam, methocarbamol (ROBAXIN)  IV, morphine injection, ondansetron **OR** ondansetron (ZOFRAN) IV, oxyCODONE-acetaminophen **AND** oxyCODONE, zolpidem  Assessment/Plan:  Principal Problem:   Back pain Active Problems:   Diabetes mellitus type 2, controlled ([place])   T11 vertebral fracture (HCC)   Discitis of thoracic region   Essential hypertension   Discitis    Acute T11-12 discitis with paraspinous abscess resulting in severe back pain  Blood cultures obtained and patient is on empiric antibiotics. Patient could not tolerate MRI yesterday. Patient seen by interventional radiology. Patient underwent aspiration of the abscess seen on CT scan. Cultures are pending. Continue current antibiotics for now.   Diabetes mellitus type 2  Continue Levemir at a lower dose with sliding scale coverage. Will hold oral hypoglycemics.   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 Antibiotics prescribed for above-mentioned reason, should cover. Patient will need outpatient urological follow-up for the bladder tumor.   Possible pneumonia No definite signs of pneumonia clinically. Patient is on empiric antibiotics.  Essential Hypertension Continue metoprolol. Holding HCTZ for now.  Hypokalemia Repleted  Chronic anemia, microcytic Continue to monitor hemoglobin. No evidence for overt bleeding. Check anemia panel in the morning.  Hyperlipidemia On statins  DVT Prophylaxis: SCDs    Code Status: Full code  Family Communication: Discussed with  the patient  Disposition Plan: PT, OT evaluation. Await cultures.     LOS: 2 days   [facility] Pager [phone] 02/29/2016, 8:56 AM  If 7PM-7AM, please contact night-coverage at [email or web address], password [facility]

## 2016-02-29 NOTE — Progress Notes (Signed)
Hypoglycemic Event  CBG: 54  Treatment: 15 GM carbohydrate snack  Symptoms: None  Follow-up CBG: Time:1659 CBG Result: 81   Possible Reasons for Event: Unknown  Comments/MD notified: Dr. [place] Pink notified. Will continue to monitor    Zola Button

## 2016-03-01 DIAGNOSIS — E11649 Type 2 diabetes mellitus with hypoglycemia without coma: Secondary | ICD-10-CM

## 2016-03-01 LAB — CBC
HEMATOCRIT: 33.9 % — AB (ref 39.0–52.0)
Hemoglobin: 10.6 g/dL — ABNORMAL LOW (ref 13.0–17.0)
MCH: 23.5 pg — AB (ref 26.0–34.0)
MCHC: 31.3 g/dL (ref 30.0–36.0)
MCV: 75 fL — AB (ref 78.0–100.0)
Platelets: 416 10*3/uL — ABNORMAL HIGH (ref 150–400)
RBC: 4.52 MIL/uL (ref 4.22–5.81)
RDW: 17.2 % — AB (ref 11.5–15.5)
WBC: 8.8 10*3/uL (ref 4.0–10.5)

## 2016-03-01 LAB — URINE CULTURE: Culture: >=100000 — AB

## 2016-03-01 LAB — RETICULOCYTES
RBC.: 4.52 MIL/uL (ref 4.22–5.81)
Retic Count, Absolute: 54.2 10*3/uL (ref 19.0–186.0)
Retic Ct Pct: 1.2 % (ref 0.4–3.1)

## 2016-03-01 LAB — GLUCOSE, CAPILLARY
GLUCOSE-CAPILLARY: 210 mg/dL — AB (ref 65–99)
GLUCOSE-CAPILLARY: 218 mg/dL — AB (ref 65–99)
Glucose-Capillary: 212 mg/dL — ABNORMAL HIGH (ref 65–99)
Glucose-Capillary: 292 mg/dL — ABNORMAL HIGH (ref 65–99)

## 2016-03-01 LAB — BASIC METABOLIC PANEL
ANION GAP: 11 (ref 5–15)
BUN: 12 mg/dL (ref 6–20)
CO2: 26 mmol/L (ref 22–32)
Calcium: 8.5 mg/dL — ABNORMAL LOW (ref 8.9–10.3)
Chloride: 98 mmol/L — ABNORMAL LOW (ref 101–111)
Creatinine, Ser: 1.14 mg/dL (ref 0.61–1.24)
GFR calc Af Amer: >60 mL/min (ref >60)
GFR calc non Af Amer: >60 mL/min (ref >60)
GLUCOSE: 173 mg/dL — AB (ref 65–99)
POTASSIUM: 3.4 mmol/L — AB (ref 3.5–5.1)
Sodium: 135 mmol/L (ref 135–145)

## 2016-03-01 LAB — VANCOMYCIN, TROUGH: Vancomycin Tr: 20 ug/mL (ref 15–20)

## 2016-03-01 LAB — IRON AND TIBC
IRON: 22 ug/dL — AB (ref 45–182)
SATURATION RATIOS: 12 % — AB (ref 17.9–39.5)
TIBC: 176 ug/dL — AB (ref 250–450)
UIBC: 154 ug/dL

## 2016-03-01 LAB — FOLATE: Folate: 12.7 ng/mL (ref >5.9)

## 2016-03-01 LAB — FERRITIN: FERRITIN: 285 ng/mL (ref 24–336)

## 2016-03-01 LAB — VITAMIN B12: Vitamin B-12: 117 pg/mL — ABNORMAL LOW (ref 180–914)

## 2016-03-01 MED ORDER — VANCOMYCIN HCL 10 G IV SOLR
1500.0000 mg | INTRAVENOUS | Status: DC
  Administered 2016-03-01: 1500 mg via INTRAVENOUS
  Filled 2016-03-01 (×3): qty 1500

## 2016-03-01 MED ORDER — SENNA 8.6 MG PO TABS
1.0000 | ORAL_TABLET | Freq: Two times a day (BID) | ORAL | Status: DC
  Administered 2016-03-01 – 2016-03-05 (×7): 8.6 mg via ORAL
  Filled 2016-03-01 (×7): qty 1

## 2016-03-01 MED ORDER — MORPHINE SULFATE (PF) 2 MG/ML IV SOLN
2.0000 mg | INTRAVENOUS | Status: DC | PRN
  Administered 2016-03-01 – 2016-03-03 (×8): 2 mg via INTRAVENOUS
  Filled 2016-03-01 (×8): qty 1

## 2016-03-01 MED ORDER — POTASSIUM CHLORIDE CRYS ER 20 MEQ PO TBCR
40.0000 meq | EXTENDED_RELEASE_TABLET | Freq: Once | ORAL | Status: AC
  Administered 2016-03-01: 40 meq via ORAL
  Filled 2016-03-01: qty 2

## 2016-03-01 MED ORDER — INSULIN GLARGINE 100 UNIT/ML ~~LOC~~ SOLN
15.0000 [IU] | Freq: Every day | SUBCUTANEOUS | Status: DC
  Administered 2016-03-02 – 2016-03-05 (×4): 15 [IU] via SUBCUTANEOUS
  Filled 2016-03-01 (×4): qty 0.15

## 2016-03-01 MED ORDER — INSULIN GLARGINE 100 UNIT/ML ~~LOC~~ SOLN
10.0000 [IU] | Freq: Every day | SUBCUTANEOUS | Status: DC
  Administered 2016-03-01: 10 [IU] via SUBCUTANEOUS
  Filled 2016-03-01: qty 0.1

## 2016-03-01 MED ORDER — HYDROMORPHONE HCL 1 MG/ML IJ SOLN
1.0000 mg | Freq: Once | INTRAMUSCULAR | Status: AC
  Administered 2016-03-01: 1 mg via INTRAVENOUS
  Filled 2016-03-01: qty 1

## 2016-03-01 MED ORDER — POLYETHYLENE GLYCOL 3350 17 G PO PACK
17.0000 g | PACK | Freq: Every day | ORAL | Status: DC
  Administered 2016-03-01 – 2016-03-05 (×5): 17 g via ORAL
  Filled 2016-03-01 (×5): qty 1

## 2016-03-01 MED ORDER — VITAMIN B-12 1000 MCG PO TABS
1000.0000 ug | ORAL_TABLET | Freq: Every day | ORAL | Status: DC
  Administered 2016-03-01 – 2016-03-05 (×5): 1000 ug via ORAL
  Filled 2016-03-01 (×5): qty 1

## 2016-03-01 NOTE — Care Management Important Message (Signed)
Important Message  Patient Details  Name: Dennis Haynes MRN: [id number] Date of Birth: 1940-06-17   Medicare Important Message Given:  Yes    [name] 03/01/2016, 1:40 PM

## 2016-03-01 NOTE — Evaluation (Signed)
Occupational Therapy Evaluation Patient Details Name: Dennis Haynes MRN: [id number] DOB: May 08, 1941 Today's Date: 03/01/2016    History of Present Illness Pt presented to ED with increased back pain over the last 10 days. Pt presented to ER secondary to generalized weakness, difficulty ambulating and persistent diarrhea; admitted with UTI and AKI secondary to dehydration.   Clinical Impression   PTA pt required assistance with all basic ADL and IADL. Currently, pt continues to require assistance with ADLs secondary to pain. Pt c/o significant pain in lower and mid back not relieved by positioning or pain medication. Pt able to ambulate with min guard assistance for functional mobility tasks. OT will conitnue to follow acutely. OT recommends D/C to SNF.    Follow Up Recommendations  SNF    Equipment Recommendations  None recommended by OT       Precautions / Restrictions Precautions Precautions: Fall Restrictions Weight Bearing Restrictions: No      Mobility Bed Mobility Overal bed mobility: Needs Assistance Bed Mobility: Supine to Sit;Sit to Supine     Supine to sit: Min assist Sit to supine: Min assist      Transfers Overall transfer level: Needs assistance Equipment used: Rolling walker (2 wheeled) Transfers: Sit to/from Stand Sit to Stand: Min assist              Balance Overall balance assessment: Needs assistance Sitting-balance support: Bilateral upper extremity supported;Feet supported Sitting balance-Leahy Scale: Good     Standing balance support: Bilateral upper extremity supported Standing balance-Leahy Scale: Poor                              ADL Overall ADL's : Needs assistance/impaired Eating/Feeding: Supervision/ safety;Set up;Sitting   Grooming: Supervision/safety;Set up;Sitting                   Toilet Transfer: Minimal assistance;Ambulation;RW             General ADL Comments: Pt refused full ADL session secondary to  pain. Ambulated to simulate toilet transfer with minimal assistance.               Pertinent Vitals/Pain Pain Assessment: 0-10 Pain Score: 9  Pain Location: back Pain Descriptors / Indicators: Grimacing;Guarding;Aching;Crying;Tightness Pain Intervention(s): Monitored during session;Limited activity within patient's tolerance;Repositioned     Hand Dominance Right   Extremity/Trunk Assessment Upper Extremity Assessment Upper Extremity Assessment: Overall WFL for tasks assessed   Lower Extremity Assessment Lower Extremity Assessment: Overall WFL for tasks assessed       Communication Communication Communication: No difficulties   Cognition Arousal/Alertness: Awake/alert Behavior During Therapy: Agitated Overall Cognitive Status: Within Functional Limits for tasks assessed                             Home Living Family/patient expects to be discharged to:: Private residence Living Arrangements: Children Available Help at Discharge: Family;Available 24 hours/day Type of Home: House       Home Layout: One level     Bathroom Shower/Tub: Occupational psychologist: Standard     Home Equipment: Environmental consultant - 2 wheels;Cane - single point;Shower seat          Prior Functioning/Environment Level of Independence: Needs assistance        Comments: Pt ambulatory at home for short distances. Reports assistance from brother at home for past 6 months. Unable to discern from pt report  amount of assistance provided.        OT Problem List: Decreased strength;Decreased range of motion;Decreased activity tolerance;Impaired balance (sitting and/or standing);Decreased knowledge of use of DME or AE;Pain   OT Treatment/Interventions: Self-care/ADL training;Therapeutic exercise;DME and/or AE instruction;Therapeutic activities    OT Goals(Current goals can be found in the care plan section) Acute Rehab OT Goals Patient Stated Goal: to get back to doing things  semi-independently again OT Goal Formulation: With patient Time For Goal Achievement: 03/15/16 Potential to Achieve Goals: Good ADL Goals Pt Will Perform Grooming: with supervision;with set-up;standing (standing at sink) Pt Will Transfer to Toilet: with min guard assist;ambulating;bedside commode (bedside commode over toilet) Pt Will Perform Toileting - Clothing Manipulation and hygiene: with min guard assist;sit to/from stand  OT Frequency: Min 1X/week    End of Session Equipment Utilized During Treatment: Gait belt;Rolling walker  Activity Tolerance: Patient limited by pain Patient left: with call bell/phone within reach;in bed   Time: 1341-1401 OT Time Calculation (min): 20 min Charges:  OT General Charges $OT Visit: 1 Procedure OT Evaluation $OT Eval Moderate Complexity: 1 Procedure G-CodesNorman Herrlich, OTR/L  [id number] 03/01/2016, 4:41 PM

## 2016-03-01 NOTE — Progress Notes (Signed)
PT Cancellation Note  Patient Details Name: Dennis Haynes MRN: [id number] DOB: January 15, 1941   Cancelled Treatment:    Reason Eval/Treat Not Completed: Patient declined, no reason specified;Pain limiting ability to participate. Pt refused to participate in PT evaluation at this time stating that he was in too much pain and did not want to move. Despite pt education on importance of moving around, pt refused to sit EOB or transfer to recliner. PT will continue to f/u with pt as appropriate.   Clearnce Sorrel Lempi Edwin 03/01/2016, 11:46 AM Sherie Don, PT, DPT [phone]

## 2016-03-01 NOTE — Progress Notes (Signed)
Inpatient Diabetes Program Recommendations  AACE/ADA: New Consensus Statement on Inpatient Glycemic Control (2015)  Target Ranges:  Prepandial:   less than 140 mg/dL      Peak postprandial:   less than 180 mg/dL (1-2 hours)      Critically ill patients:  140 - 180 mg/dL   Lab Results  Component Value Date   GLUCAP 212 (H) 03/01/2016   HGBA1C 9.8 (H) 02/06/2016    Review of Glycemic Control  Results for HAL, BOWERSOCK (MRN [id number]) as of 03/01/2016 08:57  Ref. Range 02/28/2016 16:29 02/28/2016 20:44 02/29/2016 07:06 02/29/2016 11:53 02/29/2016 16:12 02/29/2016 16:59 02/29/2016 22:17 03/01/2016 07:04  Glucose-Capillary Latest Ref Range: 65 - 99 mg/dL 119 (H) 235 (H) 173 (H) 88 54 (L) 81 210 (H) 212 (H)    Diabetes history: Type 2  Outpatient Diabetes medications: Novolog 25 units bid, Levemir 80 units qhs, Glucophage 1500mg  qam, 1000mg  qpm  Current orders for Inpatient glycemic control: Lantus 10 units daily, Novolog 0-9 units tid  Inpatient Diabetes Program Recommendations: If the patient is eating, consider starting Novolog 2 units tid with meals.  Gentry Fitz, RN, BA, MHA, CDE Diabetes Coordinator Inpatient Diabetes Program  [phone] (Team Pager) [phone] ([place]) 03/01/2016 9:05 AM

## 2016-03-01 NOTE — Progress Notes (Signed)
Pharmacy Antibiotic Note  Dennis Haynes is a 75 y.o. male admitted on 02/27/2016 with discitis/possible abcess.  Pharmacy has been consulted for Vancomycin and Zosyn Dosing.   Vancomycin trough = 20, high-end therapeutic on 750 mg Q 12 hrs. Est. T1/2 ~ 16 hrs. Scr stable  Plan: Change vancomycin to 1500 mg IV Q 24 hrs, next dose due at 2200 Continue zosyn 3.375g IV Q 8hrs Monitor renal function and f/u cultures     Temp (24hrs), Avg:98.7 F (37.1 C), Min:98.5 F (36.9 C), Max:98.8 F (37.1 C)   Recent Labs Lab 02/27/16 1340 02/27/16 1445 02/28/16 0125 02/29/16 0522 03/01/16 0522 03/01/16 1708  WBC 14.4*  --  9.4 10.3 8.8  --   CREATININE  --  1.03 1.01 1.00 1.14  --   VANCOTROUGH  --   --   --   --   --  20    Estimated Creatinine Clearance: 56.9 mL/min (by C-G formula based on SCr of 1.14 mg/dL).    No Known Allergies  Antimicrobials this admission:  Zosyn 9/20 >>  Vancomycin 9/20 >>   Dose adjustments this admission:  9/22: VT = 20 on 750mg  Q 12, changed to 1500 Q 24 hrs  Microbiology results:  8/28 UCx: 80K pan sensitive Enterococcus 9/19 UCx: reinc 9/19 BCx2: ngtd 9/20 Wound aspirate: rare GPC pairs - reinc  Maryanna Shape, PharmD, BCPS  Clinical Pharmacist  Pager: [phone]   03/01/2016 6:23 PM

## 2016-03-01 NOTE — Progress Notes (Signed)
[facility] PROGRESS NOTE  Dennis Haynes [id number] DOB: 05/10/1941 DOA: 02/27/2016  PCP: Marygrace Drought, MD  Brief History/Interval Summary: 75 year old Caucasian male with a past medical history of diabetes mellitus type 2, essential hypertension, chronic back pain, hyperlipidemia was entered with complaints of worsening back pain. Patient also was complaining of abdominal pain. He underwent a CT scan of the abdomen and pelvis. This was done at [facility]. CT scan revealed findings suggestive of acute discitis at T11-12 with an associated T11 inferior endplate fracture. Question small early right paraspinous abscess at T11/12 measuring 18 x 9 mm. Patient was hospitalized for further management.  Reason for Visit: Acute discitis with abscess  Consultants: Phone discussion with neurosurgery, Dr. Ellene Route. Interventional radiology.  Procedures: Disc aspiration by IR 9/20  Antibiotics: Vancomycin and Zosyn 9/20  Subjective/Interval History: Patient continues to feel better. Complains of constipation. Denies any nausea or vomiting. Pain is much improved.   ROS: No chest pain or shortness of breath.  Objective:  Vital Signs  Vitals:   02/29/16 0937 02/29/16 1353 02/29/16 2100 03/01/16 0500  BP: 101/64 104/65 (!) 131/55 (!) 115/57  Pulse:  67 89 71  Resp:   18 17  Temp:  98.6 F (37 C) 98.8 F (37.1 C) 98.5 F (36.9 C)  TempSrc:   Oral Oral  SpO2:  96% 97% 97%    Intake/Output Summary (Last 24 hours) at 03/01/16 0846 Last data filed at 03/01/16 0544  Gross per 24 hour  Intake              200 ml  Output              350 ml  Net             -150 ml    General appearance: alert, cooperative, appears stated age , In no distress Resp: clear to auscultation bilaterally Cardio: regular rate and rhythm, S1, S2 normal, no murmur, click, rub or gallop GI: soft, non-tender; bowel sounds normal; no masses,  no organomegaly Neurologic: Awake and alert. Oriented  3. No cranial nerve deficits. Strength is equal bilateral upper extremities. Able to move both his lower extremities.   Lab Results:  Data Reviewed: I have personally reviewed following labs and imaging studies  CBC:  Recent Labs Lab 02/27/16 1340 02/28/16 0125 02/29/16 0522 03/01/16 0522  WBC 14.4* 9.4 10.3 8.8  NEUTROABS  --  6.7  --   --   HGB 12.0* 11.0* 11.1* 10.6*  HCT 35.1* 34.2* 35.5* 33.9*  MCV 71.4* 74.7* 75.1* 75.0*  PLT 478* 393 436* 416*    Basic Metabolic Panel:  Recent Labs Lab 02/27/16 1445 02/28/16 0125 02/29/16 0522 03/01/16 0522  NA 136 132* 136 135  K 3.1* 3.3* 4.1 3.4*  CL 96* 98* 99* 98*  CO2 28 25 26 26   GLUCOSE 100* 143* 125* 173*  BUN 24* 18 12 12   CREATININE 1.03 1.01 1.00 1.14  CALCIUM 9.1 8.3* 8.8* 8.5*  MG  --   --  1.3*  --     GFR: Estimated Creatinine Clearance: 56.9 mL/min (by C-G formula based on SCr of 1.14 mg/dL).  Liver Function Tests:  Recent Labs Lab 02/27/16 1445 02/28/16 0125  AST 38 38  ALT 35 37  ALKPHOS 257* 219*  BILITOT 0.7 0.7  PROT 7.5 6.0*  ALBUMIN 2.6* 2.1*     Recent Labs Lab 02/27/16 1445  LIPASE 18   Coagulation Profile:  Recent Labs Lab 02/28/16 0932  INR 1.17    Cardiac Enzymes:  Recent Labs Lab 02/27/16 1445  TROPONINI <0.03    CBG:  Recent Labs Lab 02/29/16 1153 02/29/16 1612 02/29/16 1659 02/29/16 2217 03/01/16 0704  GLUCAP 88 54* 81 210* 212*     Recent Results (from the past 240 hour(s))  Blood culture (routine x 2)     Status: None (Preliminary result)   Collection Time: 02/27/16  6:35 PM  Result Value Ref Range Status   Specimen Description BLOOD LEFT ARM  Final   Special Requests AER 5 CC ANA 5CC  Final   Culture NO GROWTH 3 DAYS  Final   Report Status PENDING  Incomplete  Blood culture (routine x 2)     Status: None (Preliminary result)   Collection Time: 02/27/16  6:40 PM  Result Value Ref Range Status   Specimen Description BLOOD RIGHT HAND  Final     Special Requests NONE  Final   Culture NO GROWTH 3 DAYS  Final   Report Status PENDING  Incomplete  Urine culture     Status: None (Preliminary result)   Collection Time: 02/27/16  6:40 PM  Result Value Ref Range Status   Specimen Description URINE, CLEAN CATCH  Final   Special Requests NONE  Final   Culture   Final    CULTURE REINCUBATED FOR BETTER GROWTH Performed at [facility]    Report Status PENDING  Incomplete  Aerobic/Anaerobic Culture (surgical/deep wound)     Status: None (Preliminary result)   Collection Time: 02/28/16  2:41 PM  Result Value Ref Range Status   Specimen Description WOUND ASPIRATE  Final   Special Requests DISC T11/T12  Final   Gram Stain   Final    FEW WBC PRESENT, PREDOMINANTLY PMN RARE GRAM POSITIVE COCCI IN PAIRS IN SINGLES    Culture CULTURE REINCUBATED FOR BETTER GROWTH  Final   Report Status PENDING  Incomplete      Radiology Studies: Ir Fluoro Guide Ndl Plmt / Bx  Result Date: 02/28/2016 INDICATION: Severe back pain.  CT suggests discitis/osteomyelitis T11-12. EXAM: IR FLUORO GUIDE NEEDLE PLACEMENT /BIOPSY MEDICATIONS: Lidocaine 1% subcutaneous 10 mL ANESTHESIA/SEDATION: Intravenous Fentanyl and Versed were administered as conscious sedation during continuous monitoring of the patient's level of consciousness and physiological / cardiorespiratory status by the radiology RN, with a total moderate sedation time of 20 minutes. PROCEDURE: Informed written consent was obtained from the patient after a thorough discussion of the procedural risks, benefits and alternatives. All questions were addressed. Maximal Sterile Barrier Technique was utilized including caps, mask, sterile gowns, sterile gloves, sterile drape, hand hygiene and skin antiseptic. A timeout was performed prior to the initiation of the procedure. The T11-12 disc was localized and correlated to previous imaging. An appropriate skin entry site was determined and marked. Site was  marked, prepped with Betadine, draped in usual sterile fashion, infiltrated locally with 1% lidocaine. Under intermittent fluoroscopic guidance, a 16 gauge trocar needle was advanced into the interspace from a left parasagittal approach. Needle tip position confirmed in 3 projections. 5 mL of purulent material were aspirated, sent for Gram stain, culture and sensitivity. The needle was removed. The patient tolerated the procedure well. FLUOROSCOPY TIME:  30 seconds, 15 mGy COMPLICATIONS: None immediate. IMPRESSION: 1. Technically successful thoracic T11-12 disc aspiration under fluoroscopy, returning 5 mL purulent fluid, sent for Gram stain, culture and sensitivity. Electronically Signed   By: Lucrezia Europe M.D.   On: 02/28/2016 14:58     Medications:  Scheduled: . aspirin EC  81 mg Oral Daily  . atorvastatin  80 mg Oral q1800  . insulin aspart  0-9 Units Subcutaneous TID WC  . insulin glargine  10 Units Subcutaneous Daily  . metoprolol  50 mg Oral BID  . mirtazapine  30 mg Oral QHS  . pantoprazole  40 mg Oral Daily  . piperacillin-tazobactam (ZOSYN)  IV  3.375 g Intravenous Q8H  . potassium chloride  40 mEq Oral Once  . pregabalin  300 mg Oral BID  . risperiDONE  0.25 mg Oral BID  . senna  1 tablet Oral Daily  . sertraline  100 mg Oral Daily  . vancomycin  750 mg Intravenous Q12H  . vitamin B-12  1,000 mcg Oral Daily   Continuous:   [id number] **OR** acetaminophen, ALPRAZolam, methocarbamol (ROBAXIN)  IV, morphine injection, ondansetron **OR** ondansetron (ZOFRAN) IV, oxyCODONE-acetaminophen **AND** oxyCODONE, zolpidem  Assessment/Plan:  Principal Problem:   Back pain Active Problems:   Diabetes mellitus type 2, controlled ([place])   T11 vertebral fracture (HCC)   Discitis of thoracic region   Essential hypertension   Discitis    Acute T11-12 discitis with paraspinous abscess resulting in severe back pain  Blood cultures obtained and patient is on empiric antibiotics.  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 Patient was seen by interventional radiology and underwent disc aspiration. Cultures from the fluid are pending. Continue current antibiotics for now. Once final identification is available, will need to discuss with infectious disease regarding further management. In the meantime, continue pain management. Mobilize the patient. PT and OT will be ordered. He does not have any neurological deficits.  Diabetes mellitus type 2 with episodes of hypoglycemia Patient had an episode of hypoglycemia last night. Blood glucose levels are improved this morning. His dose of Lantus was decreased yesterday. Continue sliding scale coverage. Continue to hold oral hypoglycemics. CBG is trending upwards today. Will increase the dose of his Lantus.  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 Sensitivities reviewed. Continue current antibiotics for now till culture from the spinal aspiration is available. Patient will need outpatient urological follow-up for the bladder tumor.   Possible pneumonia No definite signs of pneumonia clinically. Patient is on empiric antibiotics.  Essential Hypertension Continue metoprolol. Holding HCTZ for now. Blood pressures are reasonably well controlled.  Hypokalemia Replete  Chronic anemia, microcytic Continue to monitor hemoglobin. No evidence for overt bleeding. Anemia panel reviewed. He does have vitamin B-12 deficiency. Ferritin is 286. Folate is 12.7. Start vitamin B-12 supplementation.  Hyperlipidemia On statins  Sludge versus stones in the gallbladder. Incidentally identified on CT scan. His LFTs are normal.  DVT Prophylaxis: SCDs    Code Status: Full code  Family Communication: Discussed with the patient. No family at bedside. Disposition Plan: PT, OT evaluation. Await cultures.     LOS: 3 days   [place]  [facility] Pager [phone] 03/01/2016, 8:46 AM  If 7PM-7AM, please contact night-coverage at [email or web address], password [facility]

## 2016-03-02 DIAGNOSIS — B952 Enterococcus as the cause of diseases classified elsewhere: Secondary | ICD-10-CM

## 2016-03-02 DIAGNOSIS — E118 Type 2 diabetes mellitus with unspecified complications: Secondary | ICD-10-CM

## 2016-03-02 DIAGNOSIS — N390 Urinary tract infection, site not specified: Secondary | ICD-10-CM

## 2016-03-02 DIAGNOSIS — G061 Intraspinal abscess and granuloma: Principal | ICD-10-CM

## 2016-03-02 DIAGNOSIS — L97529 Non-pressure chronic ulcer of other part of left foot with unspecified severity: Secondary | ICD-10-CM

## 2016-03-02 DIAGNOSIS — M4644 Discitis, unspecified, thoracic region: Secondary | ICD-10-CM

## 2016-03-02 DIAGNOSIS — Z794 Long term (current) use of insulin: Secondary | ICD-10-CM

## 2016-03-02 DIAGNOSIS — L97519 Non-pressure chronic ulcer of other part of right foot with unspecified severity: Secondary | ICD-10-CM

## 2016-03-02 DIAGNOSIS — E11621 Type 2 diabetes mellitus with foot ulcer: Secondary | ICD-10-CM

## 2016-03-02 DIAGNOSIS — N3289 Other specified disorders of bladder: Secondary | ICD-10-CM

## 2016-03-02 LAB — COMPREHENSIVE METABOLIC PANEL
ALK PHOS: 225 U/L — AB (ref 38–126)
ALT: 27 U/L (ref 17–63)
AST: 24 U/L (ref 15–41)
Albumin: 2.1 g/dL — ABNORMAL LOW (ref 3.5–5.0)
Anion gap: 14 (ref 5–15)
BILIRUBIN TOTAL: 0.7 mg/dL (ref 0.3–1.2)
BUN: 10 mg/dL (ref 6–20)
CALCIUM: 8.9 mg/dL (ref 8.9–10.3)
CO2: 24 mmol/L (ref 22–32)
CREATININE: 1.04 mg/dL (ref 0.61–1.24)
Chloride: 100 mmol/L — ABNORMAL LOW (ref 101–111)
Glucose, Bld: 216 mg/dL — ABNORMAL HIGH (ref 65–99)
Potassium: 3.9 mmol/L (ref 3.5–5.1)
Sodium: 138 mmol/L (ref 135–145)
Total Protein: 6.5 g/dL (ref 6.5–8.1)

## 2016-03-02 LAB — CBC
HEMATOCRIT: 35.8 % — AB (ref 39.0–52.0)
HEMOGLOBIN: 11.1 g/dL — AB (ref 13.0–17.0)
MCH: 23.3 pg — AB (ref 26.0–34.0)
MCHC: 31 g/dL (ref 30.0–36.0)
MCV: 75.2 fL — AB (ref 78.0–100.0)
PLATELETS: 409 10*3/uL — AB (ref 150–400)
RBC: 4.76 MIL/uL (ref 4.22–5.81)
RDW: 17.2 % — ABNORMAL HIGH (ref 11.5–15.5)
WBC: 10.9 10*3/uL — AB (ref 4.0–10.5)

## 2016-03-02 LAB — GLUCOSE, CAPILLARY
GLUCOSE-CAPILLARY: 195 mg/dL — AB (ref 65–99)
GLUCOSE-CAPILLARY: 162 mg/dL — AB (ref 65–99)
Glucose-Capillary: 210 mg/dL — ABNORMAL HIGH (ref 65–99)
Glucose-Capillary: 210 mg/dL — ABNORMAL HIGH (ref 65–99)

## 2016-03-02 LAB — MAGNESIUM: Magnesium: 1.2 mg/dL — ABNORMAL LOW (ref 1.7–2.4)

## 2016-03-02 LAB — C-REACTIVE PROTEIN: CRP: 6.2 mg/dL — ABNORMAL HIGH (ref <1.0)

## 2016-03-02 LAB — SEDIMENTATION RATE: SED RATE: 71 mm/h — AB (ref 0–16)

## 2016-03-02 MED ORDER — BISACODYL 10 MG RE SUPP
10.0000 mg | Freq: Every day | RECTAL | Status: DC | PRN
  Administered 2016-03-02: 10 mg via RECTAL
  Filled 2016-03-02 (×2): qty 1

## 2016-03-02 MED ORDER — OXYCODONE-ACETAMINOPHEN 5-325 MG PO TABS
2.0000 | ORAL_TABLET | Freq: Three times a day (TID) | ORAL | Status: DC
  Administered 2016-03-02 – 2016-03-03 (×3): 2 via ORAL
  Filled 2016-03-02 (×3): qty 2

## 2016-03-02 MED ORDER — SODIUM CHLORIDE 0.9 % IV SOLN
3.0000 g | Freq: Four times a day (QID) | INTRAVENOUS | Status: DC
  Administered 2016-03-02 – 2016-03-05 (×12): 3 g via INTRAVENOUS
  Filled 2016-03-02 (×15): qty 3

## 2016-03-02 MED ORDER — MAGNESIUM SULFATE 2 GM/50ML IV SOLN
2.0000 g | Freq: Once | INTRAVENOUS | Status: AC
  Administered 2016-03-02: 2 g via INTRAVENOUS
  Filled 2016-03-02 (×3): qty 50

## 2016-03-02 MED ORDER — OXYCODONE HCL 5 MG PO TABS
10.0000 mg | ORAL_TABLET | Freq: Four times a day (QID) | ORAL | Status: DC | PRN
  Administered 2016-03-02 – 2016-03-04 (×2): 10 mg via ORAL
  Filled 2016-03-02 (×2): qty 2

## 2016-03-02 NOTE — Consult Note (Signed)
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  I'd be glad to see him in outpt clinic for f/u in 3-4 weeks.   Comment- Perhaps his bladder tumor seen on CT enabled this bacteria to seed his spine?  Thank you so much for this interesting consult,   Bobby Rumpf (pager) [phone] [email or web address]  Dennis Haynes is an 75 y.o. male.  HPI: 75 yo M with hx of DM2, adm to [facility] 8-28 to 8-31 for UTI (80k pan-sens E facaelis) then d/c to rehab. He returned on 9-20 to [facility] with 10 days of worsening low back pain. 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 He had IR aspirate on day of adm.  He was started on vanco/zosyn.  His UCx has again shown E faecalis. His BCx are negative.  His aspirate Cx is E faecalis.  He has been afebrile.   Past Medical History:  Diagnosis Date  . Anxiety   . Chronic back pain   . Dementia   . Diabetes ([place])   . Hypertension     Past Surgical History:  Procedure Laterality Date  . APPENDECTOMY    . [place] ASPIRATION  02/27/2016   THORACIC  . IR GENERIC HISTORICAL  02/28/2016   IR FLUORO GUIDED NEEDLE PLC ASPIRATION/INJECTION LOC 02/28/2016 Arne Cleveland, MD [facility]-INTERV RAD     No Known Allergies  Medications:  Scheduled: . aspirin EC  81 mg Oral Daily  . atorvastatin  80 mg Oral q1800  . insulin aspart  0-9 Units Subcutaneous TID WC  . insulin glargine  15 Units Subcutaneous Daily  . magnesium sulfate 1 - 4 g bolus  IVPB  2 g Intravenous Once  . metoprolol  50 mg Oral BID  . mirtazapine  30 mg Oral QHS  . oxyCODONE-acetaminophen  2 tablet Oral Q8H  . pantoprazole  40 mg Oral Daily  . piperacillin-tazobactam (ZOSYN)  IV  3.375 g Intravenous Q8H  . polyethylene glycol  17 g Oral Daily  . pregabalin  300 mg Oral BID  . risperiDONE  0.25 mg Oral BID  . senna  1 tablet Oral BID  . sertraline  100 mg Oral Daily  . vancomycin  1,500 mg Intravenous Q24H  . vitamin B-12  1,000 mcg Oral Daily    Abtx:  Anti-infectives    Start     Dose/Rate Route Frequency Ordered Stop   03/01/16 2200  vancomycin (VANCOCIN) 1,500 mg in sodium chloride 0.9 % 500 mL IVPB     1,500 mg 250 mL/hr over 120 Minutes Intravenous Every 24 hours 03/01/16 1823     02/28/16 0600  vancomycin (VANCOCIN) IVPB 750 mg/150 ml premix  Status:  Discontinued     750 mg 150 mL/hr over 60 Minutes Intravenous Every 12 hours 02/28/16 0115 03/01/16 1823   02/28/16 0200  piperacillin-tazobactam (ZOSYN) IVPB 3.375 g     3.375 g 12.5 mL/hr over 240 Minutes Intravenous Every 8 hours 02/28/16 0115        Total days of antibiotics:  4 vanco/zosyn          Social History:  reports that he has been smoking Cigarettes.  He has been smoking about 0.50 packs per day. He has never used smokeless tobacco. He reports that he does not drink alcohol. His drug history is not on file.  Family History  Problem Relation Age of Onset  . Hypertension Other     General ROS: normal BM, occas dysuria, no neuropathy, normal vision. see HPI.   Blood pressure (!) 160/70, pulse 72, temperature 97.7 F (36.5 C), temperature source Oral, resp. rate (!) 21, height 5' 10"  (1.778 m), weight 89.7 kg (197 lb 11.2 oz), SpO2 97 %. General appearance: alert, cooperative and no distress Eyes: negative findings: conjunctivae and sclerae normal and pupils equal, round, reactive to light and accomodation Throat: normal findings: oropharynx pink & moist without lesions or  evidence of thrush Neck: no adenopathy and supple, symmetrical, trachea midline Lungs: clear to auscultation bilaterally Heart: regular rate and rhythm Abdomen: normal findings: bowel sounds normal and soft, non-tender Extremities: edema none and crusted ulcer on sole of L great toe. crusting on side of R great toe. grossly nl light touch.    Results for orders placed or performed during the hospital encounter of 02/27/16 (from the past 48 hour(s))  Glucose, capillary     Status: Abnormal   Collection Time: 02/29/16  4:12 PM  Result Value Ref Range   Glucose-Capillary 54 (L) 65 - 99 mg/dL  Glucose, capillary     Status: None   Collection Time: 02/29/16  4:59 PM  Result Value Ref Range   Glucose-Capillary 81 65 - 99 mg/dL   Comment 1 Document in Chart   Glucose, capillary     Status: Abnormal   Collection Time: 02/29/16 10:17 PM  Result Value Ref Range   Glucose-Capillary 210 (H) 65 - 99 mg/dL  CBC     Status: Abnormal   Collection Time: 03/01/16  5:22 AM  Result Value Ref Range   WBC 8.8 4.0 - 10.5 K/uL   RBC 4.52 4.22 - 5.81 MIL/uL   Hemoglobin 10.6 (L) 13.0 - 17.0 g/dL   HCT 33.9 (L) 39.0 - 52.0 %   MCV 75.0 (L) 78.0 - 100.0 fL   MCH 23.5 (L) 26.0 - 34.0 pg   MCHC 31.3 30.0 - 36.0 g/dL   RDW 17.2 (H) 11.5 - 15.5 %   Platelets 416 (H) 150 - 400 K/uL  Basic metabolic panel     Status: Abnormal   Collection Time: 03/01/16  5:22 AM  Result Value Ref Range   Sodium 135 135 - 145 mmol/L   Potassium 3.4 (L) 3.5 - 5.1 mmol/L   Chloride 98 (L) 101 - 111 mmol/L   CO2 26 22 - 32 mmol/L   Glucose, Bld 173 (H) 65 - 99 mg/dL   BUN 12 6 - 20 mg/dL   Creatinine, Ser 1.14 0.61 - 1.24 mg/dL   Calcium 8.5 (L) 8.9 - 10.3 mg/dL   GFR calc non Af Amer >60 >60 mL/min   GFR calc Af Amer >60 >60 mL/min    Comment: (NOTE) The eGFR has been calculated using the CKD EPI equation. This calculation has not been validated in all clinical situations. eGFR's persistently <60 mL/min signify  possible Chronic Kidney Disease.    Anion gap 11 5 - 15  Vitamin B12     Status: Abnormal   Collection Time: 03/01/16  5:22 AM  Result Value Ref Range  Vitamin B-12 117 (L) 180 - 914 pg/mL    Comment: (NOTE) This assay is not validated for testing neonatal or myeloproliferative syndrome specimens for Vitamin B12 levels.   Folate     Status: None   Collection Time: 03/01/16  5:22 AM  Result Value Ref Range   Folate 12.7 >5.9 ng/mL  Iron and TIBC     Status: Abnormal   Collection Time: 03/01/16  5:22 AM  Result Value Ref Range   Iron 22 (L) 45 - 182 ug/dL   TIBC 176 (L) 250 - 450 ug/dL   Saturation Ratios 12 (L) 17.9 - 39.5 %   UIBC 154 ug/dL  Ferritin     Status: None   Collection Time: 03/01/16  5:22 AM  Result Value Ref Range   Ferritin 285 24 - 336 ng/mL  Reticulocytes     Status: None   Collection Time: 03/01/16  5:22 AM  Result Value Ref Range   Retic Ct Pct 1.2 0.4 - 3.1 %   RBC. 4.52 4.22 - 5.81 MIL/uL   Retic Count, Manual 54.2 19.0 - 186.0 K/uL  Glucose, capillary     Status: Abnormal   Collection Time: 03/01/16  7:04 AM  Result Value Ref Range   Glucose-Capillary 212 (H) 65 - 99 mg/dL  Glucose, capillary     Status: Abnormal   Collection Time: 03/01/16 12:05 PM  Result Value Ref Range   Glucose-Capillary 292 (H) 65 - 99 mg/dL  Vancomycin, trough     Status: None   Collection Time: 03/01/16  5:08 PM  Result Value Ref Range   Vancomycin Tr 20 15 - 20 ug/mL  Glucose, capillary     Status: Abnormal   Collection Time: 03/01/16  5:29 PM  Result Value Ref Range   Glucose-Capillary 210 (H) 65 - 99 mg/dL  Glucose, capillary     Status: Abnormal   Collection Time: 03/01/16  9:13 PM  Result Value Ref Range   Glucose-Capillary 218 (H) 65 - 99 mg/dL  CBC     Status: Abnormal   Collection Time: 03/02/16  5:11 AM  Result Value Ref Range   WBC 10.9 (H) 4.0 - 10.5 K/uL   RBC 4.76 4.22 - 5.81 MIL/uL   Hemoglobin 11.1 (L) 13.0 - 17.0 g/dL   HCT 35.8 (L) 39.0 - 52.0 %    MCV 75.2 (L) 78.0 - 100.0 fL   MCH 23.3 (L) 26.0 - 34.0 pg   MCHC 31.0 30.0 - 36.0 g/dL   RDW 17.2 (H) 11.5 - 15.5 %   Platelets 409 (H) 150 - 400 K/uL  Comprehensive metabolic panel     Status: Abnormal   Collection Time: 03/02/16  5:11 AM  Result Value Ref Range   Sodium 138 135 - 145 mmol/L   Potassium 3.9 3.5 - 5.1 mmol/L   Chloride 100 (L) 101 - 111 mmol/L   CO2 24 22 - 32 mmol/L   Glucose, Bld 216 (H) 65 - 99 mg/dL   BUN 10 6 - 20 mg/dL   Creatinine, Ser 1.04 0.61 - 1.24 mg/dL   Calcium 8.9 8.9 - 10.3 mg/dL   Total Protein 6.5 6.5 - 8.1 g/dL   Albumin 2.1 (L) 3.5 - 5.0 g/dL   AST 24 15 - 41 U/L   ALT 27 17 - 63 U/L   Alkaline Phosphatase 225 (H) 38 - 126 U/L   Total Bilirubin 0.7 0.3 - 1.2 mg/dL   GFR calc non Af Amer >60 >60 mL/min  GFR calc Af Amer >60 >60 mL/min    Comment: (NOTE) The eGFR has been calculated using the CKD EPI equation. This calculation has not been validated in all clinical situations. eGFR's persistently <60 mL/min signify possible Chronic Kidney Disease.    Anion gap 14 5 - 15  Glucose, capillary     Status: Abnormal   Collection Time: 03/02/16  6:06 AM  Result Value Ref Range   Glucose-Capillary 210 (H) 65 - 99 mg/dL  Magnesium     Status: Abnormal   Collection Time: 03/02/16  9:46 AM  Result Value Ref Range   Magnesium 1.2 (L) 1.7 - 2.4 mg/dL  Glucose, capillary     Status: Abnormal   Collection Time: 03/02/16 11:31 AM  Result Value Ref Range   Glucose-Capillary 195 (H) 65 - 99 mg/dL      Component Value Date/Time   SDES WOUND ASPIRATE 02/28/2016 1441   SPECREQUEST DISC T11/T12 02/28/2016 1441   CULT  02/28/2016 1441    FEW ENTEROCOCCUS FAECALIS NO ANAEROBES ISOLATED; CULTURE IN PROGRESS FOR 5 DAYS    REPTSTATUS PENDING 02/28/2016 1441   No results found. Recent Results (from the past 240 hour(s))  Blood culture (routine x 2)     Status: None (Preliminary result)   Collection Time: 02/27/16  6:35 PM  Result Value Ref Range  Status   Specimen Description BLOOD LEFT ARM  Final   Special Requests AER 5 CC ANA 5CC  Final   Culture NO GROWTH 4 DAYS  Final   Report Status PENDING  Incomplete  Blood culture (routine x 2)     Status: None (Preliminary result)   Collection Time: 02/27/16  6:40 PM  Result Value Ref Range Status   Specimen Description BLOOD RIGHT HAND  Final   Special Requests NONE  Final   Culture NO GROWTH 4 DAYS  Final   Report Status PENDING  Incomplete  Urine culture     Status: Abnormal   Collection Time: 02/27/16  6:40 PM  Result Value Ref Range Status   Specimen Description URINE, CLEAN CATCH  Final   Special Requests NONE  Final   Culture >=100,000 COLONIES/mL ENTEROCOCCUS FAECALIS (A)  Final   Report Status 03/01/2016 FINAL  Final   Organism ID, Bacteria ENTEROCOCCUS FAECALIS (A)  Final      Susceptibility   Enterococcus faecalis - MIC*    AMPICILLIN <=2 SENSITIVE Sensitive     LEVOFLOXACIN 1 SENSITIVE Sensitive     NITROFURANTOIN <=16 SENSITIVE Sensitive     VANCOMYCIN 2 SENSITIVE Sensitive     * >=100,000 COLONIES/mL ENTEROCOCCUS FAECALIS  Aerobic/Anaerobic Culture (surgical/deep wound)     Status: None (Preliminary result)   Collection Time: 02/28/16  2:41 PM  Result Value Ref Range Status   Specimen Description WOUND ASPIRATE  Final   Special Requests DISC T11/T12  Final   Gram Stain   Final    FEW WBC PRESENT, PREDOMINANTLY PMN RARE GRAM POSITIVE COCCI IN PAIRS IN SINGLES    Culture   Final    FEW ENTEROCOCCUS FAECALIS NO ANAEROBES ISOLATED; CULTURE IN PROGRESS FOR 5 DAYS    Report Status PENDING  Incomplete   Organism ID, Bacteria ENTEROCOCCUS FAECALIS  Final      Susceptibility   Enterococcus faecalis - MIC*    AMPICILLIN <=2 SENSITIVE Sensitive     VANCOMYCIN 1 SENSITIVE Sensitive     GENTAMICIN SYNERGY SENSITIVE Sensitive     * FEW ENTEROCOCCUS FAECALIS  03/02/2016, 2:23 PM     LOS: 4 days    Records and images were personally reviewed where  available. Diagnosis: Enterococcal paraspinous abscess  Culture Result: E faecalis  No Known Allergies  Discharge antibiotics: unasyn 3g IVPB q6h  Duration: 4 weeks  End Date: 04-01-16  [facility] Care Per Protocol:  Labs weekly while on IV antibiotics: _x_ CBC with differential __ BMP _x_ CMP _x_ CRP _x_ ESR __ Vancomycin trough  Fax weekly labs to [phone]  Clinic Follow Up Appt: Amarilis Belflower 4 weeks.

## 2016-03-02 NOTE — Progress Notes (Signed)
Pt called for help,and was found sitting on the floor in his room. Pt stated "i did not fall, I stepped back and sat softly on the floor". Pt was helped back to bed and assessment was done. No injuries were noted. Vitals were within normal limits. PA M. Lynch was notified, no orders at this time. Pt is being monitored for any bruises or swelling that may occur.

## 2016-03-02 NOTE — Progress Notes (Addendum)
[facility] PROGRESS NOTE  Dennis Haynes [id number] DOB: 06-Feb-1941 DOA: 02/27/2016  PCP: Marygrace Drought, MD  Brief History/Interval Summary: 75 year old Caucasian male with a past medical history of diabetes mellitus type 2, essential hypertension, chronic back pain, hyperlipidemia was entered with complaints of worsening back pain. Patient also was complaining of abdominal pain. He underwent a CT scan of the abdomen and pelvis. This was done at [facility]. CT scan revealed findings suggestive of acute discitis at T11-12 with an associated T11 inferior endplate fracture. Question small early right paraspinous abscess at T11/12 measuring 18 x 9 mm. Patient was hospitalized for further management.  Reason for Visit: Acute discitis with abscess  Consultants: Phone discussion with neurosurgery, Dr. Ellene Route. Interventional radiology. Phone discussion with Dr. Christella Noa on 9/23.  Procedures: Disc aspiration by IR 9/20  Antibiotics: Vancomycin and Zosyn 9/20  Subjective/Interval History: Patient complains of severe pain in his back. He states that at times it goes into spasms. Denies any nausea or vomiting.   ROS: No chest pain or shortness of breath.  Objective:  Vital Signs  Vitals:   03/01/16 2108 03/02/16 0316 03/02/16 0427 03/02/16 0846  BP: (!) 144/76 (!) 136/56 (!) 151/64   Pulse: 87 93 87   Resp: 16 16 16    Temp: 98.8 F (37.1 C) 98 F (36.7 C) 98 F (36.7 C)   TempSrc: Oral Oral Oral   SpO2: 100% 96% 93%   Weight:    89.7 kg (197 lb 11.2 oz)  Height:    5\' 10"  (1.778 m)    Intake/Output Summary (Last 24 hours) at 03/02/16 0859 Last data filed at 03/02/16 0108  Gross per 24 hour  Intake              240 ml  Output              600 ml  Net             -360 ml    General appearance: Awake, alert. Resp: clear to auscultation bilaterally Cardio: regular rate and rhythm, S1, S2 normal, no murmur, click, rub or gallop GI: soft, non-tender; bowel  sounds normal; no masses,  no organomegaly Neurologic: Awake and alert. Oriented 3. No cranial nerve deficits. Strength is equal bilateral upper extremities. Able to move both his lower extremities.   Lab Results:  Data Reviewed: I have personally reviewed following labs and imaging studies  CBC:  Recent Labs Lab 02/27/16 1340 02/28/16 0125 02/29/16 0522 03/01/16 0522 03/02/16 0511  WBC 14.4* 9.4 10.3 8.8 10.9*  NEUTROABS  --  6.7  --   --   --   HGB 12.0* 11.0* 11.1* 10.6* 11.1*  HCT 35.1* 34.2* 35.5* 33.9* 35.8*  MCV 71.4* 74.7* 75.1* 75.0* 75.2*  PLT 478* 393 436* 416* 409*    Basic Metabolic Panel:  Recent Labs Lab 02/27/16 1445 02/28/16 0125 02/29/16 0522 03/01/16 0522 03/02/16 0511  NA 136 132* 136 135 138  K 3.1* 3.3* 4.1 3.4* 3.9  CL 96* 98* 99* 98* 100*  CO2 28 25 26 26 24   GLUCOSE 100* 143* 125* 173* 216*  BUN 24* 18 12 12 10   CREATININE 1.03 1.01 1.00 1.14 1.04  CALCIUM 9.1 8.3* 8.8* 8.5* 8.9  MG  --   --  1.3*  --   --     GFR: Estimated Creatinine Clearance: 68.1 mL/min (by C-G formula based on SCr of 1.04 mg/dL).  Liver Function Tests:  Recent Labs Lab 02/27/16  1445 02/28/16 0125 03/02/16 0511  AST 38 38 24  ALT 35 37 27  ALKPHOS 257* 219* 225*  BILITOT 0.7 0.7 0.7  PROT 7.5 6.0* 6.5  ALBUMIN 2.6* 2.1* 2.1*     Recent Labs Lab 02/27/16 1445  LIPASE 18   Coagulation Profile:  Recent Labs Lab 02/28/16 0932  INR 1.17    Cardiac Enzymes:  Recent Labs Lab 02/27/16 1445  TROPONINI <0.03    CBG:  Recent Labs Lab 03/01/16 0704 03/01/16 1205 03/01/16 1729 03/01/16 2113 03/02/16 0606  GLUCAP 212* 292* 210* 218* 210*     Recent Results (from the past 240 hour(s))  Blood culture (routine x 2)     Status: None (Preliminary result)   Collection Time: 02/27/16  6:35 PM  Result Value Ref Range Status   Specimen Description BLOOD LEFT ARM  Final   Special Requests AER 5 CC ANA 5CC  Final   Culture NO GROWTH 4 DAYS   Final   Report Status PENDING  Incomplete  Blood culture (routine x 2)     Status: None (Preliminary result)   Collection Time: 02/27/16  6:40 PM  Result Value Ref Range Status   Specimen Description BLOOD RIGHT HAND  Final   Special Requests NONE  Final   Culture NO GROWTH 4 DAYS  Final   Report Status PENDING  Incomplete  Urine culture     Status: Abnormal   Collection Time: 02/27/16  6:40 PM  Result Value Ref Range Status   Specimen Description URINE, CLEAN CATCH  Final   Special Requests NONE  Final   Culture >=100,000 COLONIES/mL ENTEROCOCCUS FAECALIS (A)  Final   Report Status 03/01/2016 FINAL  Final   Organism ID, Bacteria ENTEROCOCCUS FAECALIS (A)  Final      Susceptibility   Enterococcus faecalis - MIC*    AMPICILLIN <=2 SENSITIVE Sensitive     LEVOFLOXACIN 1 SENSITIVE Sensitive     NITROFURANTOIN <=16 SENSITIVE Sensitive     VANCOMYCIN 2 SENSITIVE Sensitive     * >=100,000 COLONIES/mL ENTEROCOCCUS FAECALIS  Aerobic/Anaerobic Culture (surgical/deep wound)     Status: None (Preliminary result)   Collection Time: 02/28/16  2:41 PM  Result Value Ref Range Status   Specimen Description WOUND ASPIRATE  Final   Special Requests DISC T11/T12  Final   Gram Stain   Final    FEW WBC PRESENT, PREDOMINANTLY PMN RARE GRAM POSITIVE COCCI IN PAIRS IN SINGLES    Culture   Final    FEW ENTEROCOCCUS FAECALIS SUSCEPTIBILITIES TO FOLLOW    Report Status PENDING  Incomplete      Radiology Studies: No results found.   Medications:  Scheduled: . aspirin EC  81 mg Oral Daily  . atorvastatin  80 mg Oral q1800  . insulin aspart  0-9 Units Subcutaneous TID WC  . insulin glargine  15 Units Subcutaneous Daily  . metoprolol  50 mg Oral BID  . mirtazapine  30 mg Oral QHS  . pantoprazole  40 mg Oral Daily  . piperacillin-tazobactam (ZOSYN)  IV  3.375 g Intravenous Q8H  . polyethylene glycol  17 g Oral Daily  . pregabalin  300 mg Oral BID  . risperiDONE  0.25 mg Oral BID  . senna  1  tablet Oral BID  . sertraline  100 mg Oral Daily  . vancomycin  1,500 mg Intravenous Q24H  . vitamin B-12  1,000 mcg Oral Daily   Continuous:   [id number] **OR** acetaminophen, ALPRAZolam, bisacodyl, methocarbamol (ROBAXIN)  IV, morphine injection, ondansetron **OR** ondansetron (ZOFRAN) IV, oxyCODONE-acetaminophen **AND** oxyCODONE, zolpidem  Assessment/Plan:  Principal Problem:   Back pain Active Problems:   Diabetes mellitus type 2, controlled ([place])   T11 vertebral fracture (HCC)   Discitis of thoracic region   Essential hypertension   Discitis    Acute T11-12 discitis with paraspinous abscess resulting in severe back pain/enterococcus  Aspirate from the spinal area is positive for enterococcus. This was also found on the urine culture as well. Blood cultures are negative. Unclear how he developed this infection. He is currently on vancomycin and Zosyn. I have consulted Dr. Johnnye Sima with infectious disease to assist with management. Anticipate his antibiotics can be changed over to ampicillin. He may need further workup to determine the reason for his discitis. Patient however continues to have severe back pain. Discussed with Dr. Cyndy Freeze with neurosurgery. 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 Dr. Cyndy Freeze reviewed his CT images and did mention that he did have severe infection and osteomyelitis on the scan. He did not feel that there was any fracture. He does not think that there is any surgical indication. He recommends pain control and a lumbar/thoracic brace. We will place him on scheduled Percocet. Continue with morphine as needed. PT and OT evaluation. Reassuringly, patient does not have any neurological deficits. Continue with laxatives.  Diabetes mellitus type 2 with episodes of hypoglycemia No further episodes of hypoglycemia. Blood glucose levels are actually somewhat high. We'll increase the dose of his Lantus tomorrow if CBG remains elevated.  Continue to hold his oral agents. Continue SSI.   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 One possible source could be the bladder tumor that was seen on the CT scan. Will discuss this with urology. Discussed with Dr. Tresa Moore with urology. He reviewed the films. 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 He does not think that this is responsible for his infection, so does not feel like patient needs any surgical intervention. He also felt based on the scan that perhaps patient had some evidence for pyelonephritis on the CT scan.  Possible pneumonia No definite signs of pneumonia clinically. Patient is on empiric antibiotics.  Essential Hypertension Continue metoprolol. Holding HCTZ for now. Blood pressures are reasonably well controlled. High readings of blood pressures are most likely secondary to pain.  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 No evidence for overt bleeding. Anemia panel reviewed. He does have vitamin B-12 deficiency. Ferritin is 286. Folate is 12.7. Start vitamin B-12 supplementation.  Hyperlipidemia On statins  Sludge versus stones in the gallbladder. Incidentally identified on CT scan. His LFTs are normal. Lipase was normal.  DVT Prophylaxis: SCDs    Code Status: Full code  Family Communication: Discussed with the patient. Discussed with his son, Zenia Resides, yesterday. Disposition Plan: Management as outlined above. Patient will most likely need to go to skilled nursing facility for rehabilitation.    LOS: 4 days   [facility] Pager [phone] 03/02/2016, 8:59 AM  If 7PM-7AM, please contact night-coverage at [email or web address], password [facility]

## 2016-03-02 NOTE — Progress Notes (Addendum)
lPatient ID: Dennis Haynes, male   DOB: Sep 25, 1940, 75 y.o.   MRN: [id number] BP (!) 151/64 (BP Location: Right Arm)   Pulse 87   Temp 98 F (36.7 C) (Oral)   Resp 16   Ht 5\' 10"  (1.778 m)   Wt 89.7 kg (197 lb 11.2 oz)   SpO2 93%   BMI 28.37 kg/m  Called regarding possible etiology for continued severe back pain. I do believe the osteomyelitis at T11-12 is the most likely reason for the pain. He is not a candidate for any intervention, as his spine is not unstable, kyphoplasty not indicated into infected bone, and he is neurologically intact per the medical attending. 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 There is no radiographic evidence of canal compromise. He is being treated for the gram negative paraspinous and spondylodiscitis present at the T11/12 level.

## 2016-03-02 NOTE — Evaluation (Signed)
Physical Therapy Evaluation Patient Details Name: Dennis Haynes MRN: [id number] DOB: 07-04-1940 Today's Date: 03/02/2016   History of Present Illness  Pt presented to ED with increased back pain over the last 10 days. Pt presented to ER secondary to generalized weakness, difficulty ambulating and persistent diarrhea; admitted with UTI and AKI secondary to dehydration. Paraspinal abscess and bladder tumor recently discovered.  Clinical Impression  Pt presented supine in bed with HOB elevated, awake and willing to participate in therapy session. Prior to admission, pt stated that he ambulated with use of a SPC. Pt was greatly limited secondary to pain during evaluation. Upon achieving sitting EOB with min A, pt stated that he needed to use the bedside commode for BM. Pt transferred from bed to bedside commode with min A. Pt's nurse was in his room and agreeable to assist pt back to bed. Pt would continue to benefit from skilled physical therapy services at this time while admitted and after d/c to address his below listed limitations in order to improve his overall safety and independence with functional mobility.      Follow Up Recommendations SNF;Supervision/Assistance - 24 hour    Equipment Recommendations  Rolling walker with 5" wheels;3in1 (PT)    Recommendations for Other Services       Precautions / Restrictions Precautions Precautions: Fall Restrictions Weight Bearing Restrictions: No      Mobility  Bed Mobility Overal bed mobility: Needs Assistance Bed Mobility: Supine to Sit     Supine to sit: Min assist     General bed mobility comments: pt required increased time, use of bed rails and min A with bilateral LEs  Transfers Overall transfer level: Needs assistance Equipment used: Rolling walker (2 wheeled) Transfers: Sit to/from [facility] Sit to Stand: Min assist Stand pivot transfers: Min assist       General transfer comment: Pt required increased  time, VC'ing for bilateral hand placement and min A to achieve full standing position  Ambulation/Gait             General Gait Details: did not occur secondary to pt's report of significant pain and needing to use the bedside commode  Stairs            Wheelchair Mobility    Modified Rankin (Stroke Patients Only)       Balance Overall balance assessment: Needs assistance Sitting-balance support: Feet supported;Bilateral upper extremity supported Sitting balance-Leahy Scale: Poor     Standing balance support: During functional activity;Bilateral upper extremity supported Standing balance-Leahy Scale: Poor                               Pertinent Vitals/Pain Pain Assessment: Faces Faces Pain Scale: Hurts even more Pain Location: back and stomach Pain Descriptors / Indicators: Grimacing;Guarding;Moaning Pain Intervention(s): Monitored during session;Repositioned    Home Living Family/patient expects to be discharged to:: Private residence Living Arrangements: Children Available Help at Discharge: Family;Available 24 hours/day Type of Home: House Home Access: Stairs to enter Entrance Stairs-Rails: None (pt states he can hold onto the columns of his house) Entrance Stairs-Number of Steps: 3 Home Layout: One level Home Equipment: Cane - single point;Shower seat Additional Comments: pt stated that he currently lives with his brother but that his son and son's wife were planning to move in with him    Prior Function Level of Independence: Independent with assistive device(s)         Comments: pt  reported using a SPC during ambulation. 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      Hand Dominance   Dominant Hand: Right    Extremity/Trunk Assessment   Upper Extremity Assessment: Defer to OT evaluation           Lower Extremity Assessment: Generalized weakness         Communication   Communication: No  difficulties  Cognition Arousal/Alertness: Awake/alert Behavior During Therapy: WFL for tasks assessed/performed Overall Cognitive Status: Within Functional Limits for tasks assessed                      General Comments      Exercises     Assessment/Plan    PT Assessment Patient needs continued PT services  PT Problem List Decreased strength;Decreased range of motion;Decreased activity tolerance;Decreased balance;Decreased mobility;Decreased coordination;Decreased knowledge of use of DME;Pain          PT Treatment Interventions DME instruction;Gait training;Stair training;Therapeutic activities;Functional mobility training;Therapeutic exercise;Balance training;Neuromuscular re-education;Patient/family education    PT Goals (Current goals can be found in the Care Plan section)  Acute Rehab PT Goals Patient Stated Goal: to feel better PT Goal Formulation: With patient Time For Goal Achievement: 03/09/16 Potential to Achieve Goals: Fair    Frequency Min 3X/week   Barriers to discharge        Co-evaluation               End of Session Equipment Utilized During Treatment: Gait belt Activity Tolerance: Patient limited by pain Patient left: with nursing/sitter in room;with call bell/phone within reach;Other (comment) (pt sitting on bedside commode to attempt to have BM) Nurse Communication: Mobility status         Time: [id number] PT Time Calculation (min) (ACUTE ONLY): 14 min   Charges:   PT Evaluation $PT Eval Moderate Complexity: 1 Procedure     PT G CodesClearnce Sorrel Atreyu Mak 03/02/2016, 5:44 PM Sherie Don, [place], DPT [phone]

## 2016-03-02 NOTE — Consult Note (Signed)
Reason for Consult: Bladder Neoplasm, Enterococcus Cystitis, Bilateral Non-complex Renal Cysts  Referring Physician: [name] is an 75 y.o. male.   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 No hydro or pelvic adenopathy.  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 Also with pan-sensitive enteroccoccus from [facility] same time. No stones / hdyro / GU abscesses on imaging.  He is insulin dependant diabetic.   3 - Bilateral Non-complex Renal Cysts - <2cm Rt mid and <2cm mid and lower cysts non-enhancing, no mass effect, no coars calcifications by CT 02/2016.  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 "has never worked well". Now minimal bother but device remains in situ.   Today "Dennis Haynes" is seen in consultaiton for above.    Past Medical History:  Diagnosis Date  . Anxiety   . Chronic back pain   . Dementia   . Diabetes ([place])   . Hypertension     Past Surgical History:  Procedure Laterality Date  . APPENDECTOMY    . [place] ASPIRATION  02/27/2016   THORACIC  . IR GENERIC HISTORICAL  02/28/2016   IR FLUORO GUIDED NEEDLE PLC ASPIRATION/INJECTION LOC 02/28/2016 Dennis Cleveland, MD [facility]-INTERV RAD    Family History  Problem Relation Age of Onset  . Hypertension Other     Social History:  reports that he has been smoking Cigarettes.  He has been smoking about 0.50 packs per day. He has never used smokeless tobacco. He reports that he does not drink alcohol. His drug history is not on file.  Allergies: No Known Allergies  Medications: I have reviewed the patient's current medications.  Results for orders placed or performed during the hospital encounter of 02/27/16 (from the past 48 hour(s))  Glucose, capillary     Status: None   Collection Time: 02/29/16 11:53 AM  Result Value  Ref Range   Glucose-Capillary 88 65 - 99 mg/dL   Comment 1 Repeat Test    Comment 2 Document in Chart   Glucose, capillary     Status: Abnormal   Collection Time: 02/29/16  4:12 PM  Result Value Ref Range   Glucose-Capillary 54 (L) 65 - 99 mg/dL  Glucose, capillary     Status: None   Collection Time: 02/29/16  4:59 PM  Result Value Ref Range   Glucose-Capillary 81 65 - 99 mg/dL   Comment 1 Document in Chart   Glucose, capillary     Status: Abnormal   Collection Time: 02/29/16 10:17 PM  Result Value Ref Range   Glucose-Capillary 210 (H) 65 - 99 mg/dL  CBC     Status: Abnormal   Collection Time: 03/01/16  5:22 AM  Result Value Ref Range   WBC 8.8 4.0 - 10.5 K/uL   RBC 4.52 4.22 - 5.81 MIL/uL   Hemoglobin 10.6 (L) 13.0 - 17.0 g/dL   HCT 33.9 (L) 39.0 - 52.0 %   MCV 75.0 (L) 78.0 - 100.0 fL   MCH 23.5 (L) 26.0 - 34.0 pg   MCHC 31.3 30.0 - 36.0 g/dL   RDW 17.2 (H) 11.5 - 15.5 %   Platelets 416 (H) 150 - 400 K/uL  Basic metabolic panel     Status: Abnormal   Collection Time: 03/01/16  5:22 AM  Result Value Ref Range   Sodium 135  135 - 145 mmol/L   Potassium 3.4 (L) 3.5 - 5.1 mmol/L   Chloride 98 (L) 101 - 111 mmol/L   CO2 26 22 - 32 mmol/L   Glucose, Bld 173 (H) 65 - 99 mg/dL   BUN 12 6 - 20 mg/dL   Creatinine, Ser 1.14 0.61 - 1.24 mg/dL   Calcium 8.5 (L) 8.9 - 10.3 mg/dL   GFR calc non Af Amer >60 >60 mL/min   GFR calc Af Amer >60 >60 mL/min    Comment: (NOTE) The eGFR has been calculated using the CKD EPI equation. This calculation has not been validated in all clinical situations. eGFR's persistently <60 mL/min signify possible Chronic Kidney Disease.    Anion gap 11 5 - 15  Vitamin B12     Status: Abnormal   Collection Time: 03/01/16  5:22 AM  Result Value Ref Range   Vitamin B-12 117 (L) 180 - 914 pg/mL    Comment: (NOTE) This assay is not validated for testing neonatal or myeloproliferative syndrome specimens for Vitamin B12 levels.   Folate     Status: None    Collection Time: 03/01/16  5:22 AM  Result Value Ref Range   Folate 12.7 >5.9 ng/mL  Iron and TIBC     Status: Abnormal   Collection Time: 03/01/16  5:22 AM  Result Value Ref Range   Iron 22 (L) 45 - 182 ug/dL   TIBC 176 (L) 250 - 450 ug/dL   Saturation Ratios 12 (L) 17.9 - 39.5 %   UIBC 154 ug/dL  Ferritin     Status: None   Collection Time: 03/01/16  5:22 AM  Result Value Ref Range   Ferritin 285 24 - 336 ng/mL  Reticulocytes     Status: None   Collection Time: 03/01/16  5:22 AM  Result Value Ref Range   Retic Ct Pct 1.2 0.4 - 3.1 %   RBC. 4.52 4.22 - 5.81 MIL/uL   Retic Count, Manual 54.2 19.0 - 186.0 K/uL  Glucose, capillary     Status: Abnormal   Collection Time: 03/01/16  7:04 AM  Result Value Ref Range   Glucose-Capillary 212 (H) 65 - 99 mg/dL  Glucose, capillary     Status: Abnormal   Collection Time: 03/01/16 12:05 PM  Result Value Ref Range   Glucose-Capillary 292 (H) 65 - 99 mg/dL  Vancomycin, trough     Status: None   Collection Time: 03/01/16  5:08 PM  Result Value Ref Range   Vancomycin Tr 20 15 - 20 ug/mL  Glucose, capillary     Status: Abnormal   Collection Time: 03/01/16  5:29 PM  Result Value Ref Range   Glucose-Capillary 210 (H) 65 - 99 mg/dL  Glucose, capillary     Status: Abnormal   Collection Time: 03/01/16  9:13 PM  Result Value Ref Range   Glucose-Capillary 218 (H) 65 - 99 mg/dL  CBC     Status: Abnormal   Collection Time: 03/02/16  5:11 AM  Result Value Ref Range   WBC 10.9 (H) 4.0 - 10.5 K/uL   RBC 4.76 4.22 - 5.81 MIL/uL   Hemoglobin 11.1 (L) 13.0 - 17.0 g/dL   HCT 35.8 (L) 39.0 - 52.0 %   MCV 75.2 (L) 78.0 - 100.0 fL   MCH 23.3 (L) 26.0 - 34.0 pg   MCHC 31.0 30.0 - 36.0 g/dL   RDW 17.2 (H) 11.5 - 15.5 %   Platelets 409 (H) 150 - 400 K/uL  Comprehensive  metabolic panel     Status: Abnormal   Collection Time: 03/02/16  5:11 AM  Result Value Ref Range   Sodium 138 135 - 145 mmol/L   Potassium 3.9 3.5 - 5.1 mmol/L   Chloride 100 (L)  101 - 111 mmol/L   CO2 24 22 - 32 mmol/L   Glucose, Bld 216 (H) 65 - 99 mg/dL   BUN 10 6 - 20 mg/dL   Creatinine, Ser 1.04 0.61 - 1.24 mg/dL   Calcium 8.9 8.9 - 10.3 mg/dL   Total Protein 6.5 6.5 - 8.1 g/dL   Albumin 2.1 (L) 3.5 - 5.0 g/dL   AST 24 15 - 41 U/L   ALT 27 17 - 63 U/L   Alkaline Phosphatase 225 (H) 38 - 126 U/L   Total Bilirubin 0.7 0.3 - 1.2 mg/dL   GFR calc non Af Amer >60 >60 mL/min   GFR calc Af Amer >60 >60 mL/min    Comment: (NOTE) The eGFR has been calculated using the CKD EPI equation. This calculation has not been validated in all clinical situations. eGFR's persistently <60 mL/min signify possible Chronic Kidney Disease.    Anion gap 14 5 - 15  Glucose, capillary     Status: Abnormal   Collection Time: 03/02/16  6:06 AM  Result Value Ref Range   Glucose-Capillary 210 (H) 65 - 99 mg/dL  Magnesium     Status: Abnormal   Collection Time: 03/02/16  9:46 AM  Result Value Ref Range   Magnesium 1.2 (L) 1.7 - 2.4 mg/dL    No results found.  Review of Systems  Constitutional: Positive for malaise/fatigue.  HENT: Negative.   Eyes: Negative.   Respiratory: Negative.   Cardiovascular: Negative.   Gastrointestinal: Negative.   Genitourinary: Positive for hematuria.  Musculoskeletal: Negative.   Skin: Negative.   Neurological: Positive for dizziness.  Endo/Heme/Allergies: Negative.   Psychiatric/Behavioral: Negative.    Blood pressure (!) 151/64, pulse 87, temperature 98 F (36.7 C), temperature source Oral, resp. rate 16, height 5' 10"  (1.778 m), weight 89.7 kg (197 lb 11.2 oz), SpO2 93 %. Physical Exam  Constitutional:  Stigmata of chronic disease. Poor hygiene.   HENT:  Head: Normocephalic.  Eyes: Pupils are equal, round, and reactive to light.  Neck: Normal range of motion.  Cardiovascular: Normal rate.   Respiratory: Effort normal.  GI: Soft.  Significant truncal obesity.   Genitourinary:  Genitourinary Comments: Malleable penile prosthesis  in situ w/o errosion.   Musculoskeletal: Normal range of motion.  Neurological: He is alert.  Very talkative.   Skin: Skin is warm.  Psychiatric: Judgment and thought content normal.    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 Natural history and overall management path with elective transurethral resection, retrogrades discussed. Not candidate for surgery during this hospitalization given active infections.   2 - Enterococcus Cystitis - This should resolve quickly on current ABX. No evidence of hydro / stones / GU fistula / or infected prosthesis.   3 - Bilateral Non-complex Renal Cysts - Nil risk of neoplasm. No further evaluation warranted.   4 - Severe Erectile Dysfunction - presence of prosthesis may complicate future cancer treatment, may even require explantation if adequate Transurethral resection of tumor cannot be performed. This does not appear infected.   We will request GU follow up for cysto and office visit at our [facility] location which is close to his home. Please call me directly with questions anytime.   Hydee Fleece 03/02/2016,  11:32 AM

## 2016-03-03 LAB — GLUCOSE, CAPILLARY
Glucose-Capillary: 176 mg/dL — ABNORMAL HIGH (ref 65–99)
Glucose-Capillary: 216 mg/dL — ABNORMAL HIGH (ref 65–99)
Glucose-Capillary: 196 mg/dL — ABNORMAL HIGH (ref 65–99)
Glucose-Capillary: 194 mg/dL — ABNORMAL HIGH (ref 65–99)

## 2016-03-03 LAB — CULTURE, BLOOD (ROUTINE X 2)
Culture: NO GROWTH
Culture: NO GROWTH

## 2016-03-03 MED ORDER — SODIUM CHLORIDE 0.9% FLUSH
10.0000 mL | INTRAVENOUS | Status: DC | PRN
  Administered 2016-03-04: 10 mL
  Filled 2016-03-03: qty 40

## 2016-03-03 MED ORDER — SODIUM CHLORIDE 0.9% FLUSH
10.0000 mL | Freq: Two times a day (BID) | INTRAVENOUS | Status: DC
  Administered 2016-03-04: 10 mL

## 2016-03-03 MED ORDER — OXYCODONE-ACETAMINOPHEN 5-325 MG PO TABS
2.0000 | ORAL_TABLET | Freq: Four times a day (QID) | ORAL | Status: DC
  Administered 2016-03-03 – 2016-03-05 (×10): 2 via ORAL
  Filled 2016-03-03 (×10): qty 2

## 2016-03-03 NOTE — Clinical Social Work Placement (Signed)
   CLINICAL SOCIAL WORK PLACEMENT  NOTE  Date:  03/03/2016  Patient Details  Name: Dennis Haynes MRN: [id number] Date of Birth: May 11, 1941  Clinical Social Work is seeking post-discharge placement for this patient at the [place] level of care (*CSW will initial, date and re-position this form in  chart as items are completed):  Yes   Patient/family provided with [place] Work Department's list of facilities offering this level of care within the geographic area requested by the patient (or if unable, by the patient's family).  Yes   Patient/family informed of their freedom to choose among providers that offer the needed level of care, that participate in Medicare, Medicaid or managed care program needed by the patient, have an available bed and are willing to accept the patient.  Yes   Patient/family informed of [facility]'s ownership interest in [facility] and [facility], as well as of the fact that they are under no obligation to receive care at these facilities.  PASRR submitted to EDS on       PASRR number received on       Existing PASRR number confirmed on       FL2 transmitted to all facilities in geographic area requested by pt/family on 03/03/16     FL2 transmitted to all facilities within larger geographic area on       Patient informed that his/her managed care company has contracts with or will negotiate with certain facilities, including the following:            Patient/family informed of bed offers received.  Patient chooses bed at       Physician recommends and patient chooses bed at      Patient to be transferred to   on  .  Patient to be transferred to facility by       Patient family notified on   of transfer.  Name of family member notified:        PHYSICIAN Please prepare priority discharge summary, including medications, Please prepare prescriptions, Please sign FL2     Additional Comment:     _______________________________________________ Junie Spencer, LCSW 03/03/2016, 11:41 AM

## 2016-03-03 NOTE — Progress Notes (Signed)
Peripherally Inserted Central Catheter/Midline Placement  The IV Nurse has discussed with the patient and/or persons authorized to consent for the patient, the purpose of this procedure and the potential benefits and risks involved with this procedure.  The benefits include less needle sticks, lab draws from the catheter, and the patient may be discharged home with the catheter. Risks include, but not limited to, infection, bleeding, blood clot (thrombus formation), and puncture of an artery; nerve damage and irregular heartbeat and possibility to perform a PICC exchange if needed/ordered by physician.  Alternatives to this procedure were also discussed.  Bard Power PICC patient education guide, fact sheet on infection prevention and patient information card has been provided to patient /or left at bedside.    PICC/Midline Placement Documentation  PICC Single Lumen [id number] PICC Right Basilic 39 cm 0 cm (Active)  Indication for Insertion or Continuance of Line Home intravenous therapies (PICC only) 03/03/2016  6:12 PM  Exposed Catheter (cm) 0 cm 03/03/2016  6:12 PM  Site Assessment Clean;Dry;Intact 03/03/2016  6:12 PM  Line Status Flushed;Saline locked;Blood return noted 03/03/2016  6:12 PM  Dressing Type Transparent 03/03/2016  6:12 PM  Dressing Status Clean;Dry;Intact 03/03/2016  6:12 PM  Dressing Change Due 03/10/16 03/03/2016  6:12 PM       [place] Payment 03/03/2016, 6:13 PM

## 2016-03-03 NOTE — Clinical Social Work Note (Signed)
Clinical Social Work Assessment  Patient Details  Name: Dennis Haynes MRN: [id number] Date of Birth: Oct 18, 1940  Date of referral:  03/03/16               Reason for consult:  Discharge Planning                Permission sought to share information with:  Case Manager, Facility Sport and exercise psychologist, Family Supports Permission granted to share information::  No  Name::        Agency::   (SNF)  Relationship::     Contact Information:     Housing/Transportation Living arrangements for the past 2 months:  Single Family Home Source of Information:  Patient, Medical Team Patient Interpreter Needed:  None Criminal Activity/Legal Involvement Pertinent to Current Situation/Hospitalization:  No - Comment as needed Significant Relationships:  Adult Children Lives with:  Self Do you feel safe going back to the place where you live?  No Need for family participation in patient care:  Yes (Comment)  Care giving concerns: Pt expressed no care giving concerns at this time. Pt agreeable to SNF for higher level of care.    Social Worker assessment / plan: [place] representative met with pt and discussed CSW role with discharge planning. CSW also explained PT recommendation for SNF. Pt agreeable to SNF for short-term rehab. Pt requested SNF in [place].   CSW will follow up with bed offers once available.  Employment status:  Disabled (Comment on whether or not currently receiving Disability) Insurance information:  Medicare PT Recommendations:  [place] / Referral to community resources:  [place]  Patient/Family's Response to care: Pt pleasant and lying in bed when CSW entered the room. Pt appears happy with care he is receiving at [facility].   Patient/Family's Understanding of and Emotional Response to Diagnosis, Current Treatment, and Prognosis: Pt appears to have a good understanding of reason for admission into the hospital and with pt care plan.    Emotional Assessment Appearance:  Appears stated age Attitude/Demeanor/Rapport:   (Pleasant) Affect (typically observed):  Accepting, Appropriate, Pleasant Orientation:  Oriented to Self, Oriented to Place, Oriented to  Time, Oriented to Situation Alcohol / Substance use:  Not Applicable Psych involvement (Current and /or in the community):  No (Comment)  Discharge Needs  Concerns to be addressed:  Discharge Planning Concerns Readmission within the last 30 days:  No Current discharge risk:  None Barriers to Discharge:  Continued Medical Work up   [employer], LCSW 03/03/2016, 11:23 AM

## 2016-03-03 NOTE — NC FL2 (Signed)
[place] LEVEL OF CARE SCREENING TOOL     IDENTIFICATION  Patient Name: Dennis Haynes Birthdate: Jul 18, 1940 Sex: male Admission Date (Current Location): 02/27/2016  [facility] and [place] Number:  Herbalist and Address:  [facility] [place] [facility], [place] [address] [place], [place] [address]      Provider Number: [id number]  Attending Physician Name and Address:  Bonnielee Haff, MD  Relative Name and Phone Number:       Current Level of Care: Hospital Recommended Level of Care: [place] Prior Approval Number:    Date Approved/Denied:   PASRR Number:    Discharge Plan: SNF    Current Diagnoses: Patient Active Problem List   Diagnosis Date Noted  . Type 2 diabetes mellitus with complication, with long-term current use of insulin ([place])   . Back pain 02/28/2016  . T11 vertebral fracture ([place]) 02/28/2016  . Discitis of thoracic region 02/28/2016  . Essential hypertension 02/28/2016  . Discitis 02/28/2016  . Diabetes mellitus type 2, controlled ([place])   . Pressure ulcer 02/07/2016  . AKI (acute kidney injury) ([place]) 02/05/2016    Orientation RESPIRATION BLADDER Height & Weight     Self, Situation, Place, Time  Normal Continent Weight: 197 lb 11.2 oz (89.7 kg) Height:  5\' 10"  (177.8 cm)  BEHAVIORAL SYMPTOMS/MOOD NEUROLOGICAL BOWEL NUTRITION STATUS   (None)  (None) Continent Diet (Carb modified)  AMBULATORY STATUS COMMUNICATION OF NEEDS Skin   Extensive Assist Verbally Normal                       Personal Care Assistance Level of Assistance  Bathing, Feeding, Dressing Bathing Assistance: Limited assistance Feeding assistance: Independent Dressing Assistance: Limited assistance     Functional Limitations Info  Sight, Hearing, Speech Sight Info: Adequate Hearing Info: Adequate Speech Info: Adequate    SPECIAL CARE FACTORS FREQUENCY  PT (By licensed PT), OT (By licensed OT)     PT Frequency:  (5x/week) OT  Frequency:  (5x/week)            Contractures Contractures Info: Not present    Additional Factors Info  Code Status, Allergies Code Status Info:  (full) Allergies Info:  (NKDA)           Current Medications (03/03/2016):  This is the current hospital active medication list Current Facility-Administered Medications  Medication Dose Route Frequency Provider Last Rate Last Dose  . acetaminophen (TYLENOL) tablet 650 mg  650 mg Oral Q6H PRN Rise Patience, MD       Or  . acetaminophen (TYLENOL) suppository 650 mg  650 mg Rectal Q6H PRN Rise Patience, MD      . ALPRAZolam Duanne Moron) tablet 0.5 mg  0.5 mg Oral TID PRN Rise Patience, MD   0.5 mg at 03/02/16 2025  . Ampicillin-Sulbactam (UNASYN) 3 g in sodium chloride 0.9 % 100 mL IVPB  3 g Intravenous Q6H Campbell Riches, MD   3 g at 03/03/16 1003  . aspirin EC tablet 81 mg  81 mg Oral Daily Rise Patience, MD   81 mg at 03/03/16 0955  . atorvastatin (LIPITOR) tablet 80 mg  80 mg Oral q1800 Rise Patience, MD   80 mg at 03/02/16 1721  . bisacodyl (DULCOLAX) suppository 10 mg  10 mg Rectal Daily PRN Bonnielee Haff, MD   10 mg at 03/02/16 0104  . insulin aspart (novoLOG) injection 0-9 Units  0-9 Units Subcutaneous TID WC  Rise Patience, MD   2 Units at 03/03/16 [phone]  . insulin glargine (LANTUS) injection 15 Units  15 Units Subcutaneous Daily Bonnielee Haff, MD   15 Units at 03/03/16 0954  . methocarbamol (ROBAXIN) 500 mg in dextrose 5 % 50 mL IVPB  500 mg Intravenous Q8H PRN Bonnielee Haff, MD   500 mg at 03/01/16 1756  . metoprolol (LOPRESSOR) tablet 50 mg  50 mg Oral BID Rise Patience, MD   50 mg at 03/03/16 0955  . mirtazapine (REMERON) tablet 30 mg  30 mg Oral QHS Rise Patience, MD   30 mg at 03/02/16 2158  . morphine 2 MG/ML injection 2 mg  2 mg Intravenous Q3H PRN Bonnielee Haff, MD   2 mg at 03/02/16 1838  . ondansetron (ZOFRAN) tablet 4 mg  4 mg Oral Q6H PRN Rise Patience, MD       Or   . ondansetron [facility]) injection 4 mg  4 mg Intravenous Q6H PRN Rise Patience, MD      . oxyCODONE (Oxy IR/ROXICODONE) immediate release tablet 10 mg  10 mg Oral Q6H PRN Bonnielee Haff, MD   10 mg at 03/02/16 1628  . oxyCODONE-acetaminophen (PERCOCET/ROXICET) 5-325 MG per tablet 2 tablet  2 tablet Oral QID Bonnielee Haff, MD   2 tablet at 03/03/16 1002  . pantoprazole (PROTONIX) EC tablet 40 mg  40 mg Oral Daily Rise Patience, MD   40 mg at 03/03/16 0956  . polyethylene glycol (MIRALAX / GLYCOLAX) packet 17 g  17 g Oral Daily Bonnielee Haff, MD   17 g at 03/03/16 0953  . pregabalin (LYRICA) capsule 300 mg  300 mg Oral BID Rise Patience, MD   300 mg at 03/03/16 0955  . risperiDONE (RISPERDAL) tablet 0.25 mg  0.25 mg Oral BID Rise Patience, MD   0.25 mg at 03/03/16 0956  . senna (SENOKOT) tablet 8.6 mg  1 tablet Oral BID Bonnielee Haff, MD   8.6 mg at 03/03/16 0955  . sertraline (ZOLOFT) tablet 100 mg  100 mg Oral Daily Rise Patience, MD   100 mg at 03/03/16 0955  . vitamin B-12 (CYANOCOBALAMIN) tablet 1,000 mcg  1,000 mcg Oral Daily Bonnielee Haff, MD   1,000 mcg at 03/03/16 0956  . zolpidem (AMBIEN) tablet 5 mg  5 mg Oral QHS PRN Rise Patience, MD   5 mg at 03/02/16 2158     Discharge Medications: Please see discharge summary for a list of discharge medications.  Relevant Imaging Results:  Relevant Lab Results:   Additional Information ss#: [id number]  Junie Spencer, LCSW

## 2016-03-03 NOTE — Progress Notes (Signed)
[facility] PROGRESS NOTE  Dennis Haynes [id number] DOB: [birth year] DOA: 02/27/2016  PCP: Marygrace Drought, MD  Brief History/Interval Summary: 75 year old Caucasian male with a past medical history of diabetes mellitus type 2, essential hypertension, chronic back pain, hyperlipidemia was entered with complaints of worsening back pain. Patient also was complaining of abdominal pain. He underwent a CT scan of the abdomen and pelvis. This was done at [facility]. CT scan revealed findings suggestive of acute discitis at T11-12 with an associated T11 inferior endplate fracture. Question small early right paraspinous abscess at T11/12 measuring 18 x 9 mm. Patient was hospitalized for further management. Patient underwent the disc aspiration. Based on the culture reports. Antibiotics were changed.  Reason for Visit: Acute discitis with abscess  Consultants: Phone discussion with neurosurgery, Dr. Ellene Route. Interventional radiology. Phone discussion with Dr. Christella Noa on 9/23. Dr. Johnnye Sima with infectious disease.  Procedures: Disc aspiration by IR 9/20  Antibiotics: Vancomycin and Zosyn 9/20-9/23 Unasyn 9/23  Subjective/Interval History: Patient appears to be more comfortable this morning. His pain levels have improved. However, still he would get spasms in his back. He denies any nausea or vomiting.    ROS: No chest pain or shortness of breath.  Objective:  Vital Signs  Vitals:   03/02/16 0846 03/02/16 1200 03/02/16 1936 03/03/16 0410  BP:  (!) 160/70 123/84 134/77  Pulse:  72 81 63  Resp:  (!) 21 18 18   Temp:  97.7 F (36.5 C) 97.3 F (36.3 C) 97.9 F (36.6 C)  TempSrc:  Oral Oral Oral  SpO2:  97% 97% 96%  Weight: 89.7 kg (197 lb 11.2 oz)     Height: 5\' 10"  (1.778 m)       Intake/Output Summary (Last 24 hours) at 03/03/16 0941 Last data filed at 03/02/16 2130  Gross per 24 hour  Intake             1115 ml  Output              300 ml  Net              815 ml     General appearance: Awake, alert. Resp: clear to auscultation bilaterally Cardio: regular rate and rhythm, S1, S2 normal, no murmur, click, rub or gallop GI: soft, non-tender; bowel sounds normal; no masses,  no organomegaly Neurologic: Awake and alert. Oriented 3. No cranial nerve deficits. Strength is equal bilateral upper extremities. Able to move both his lower extremities.   Lab Results:  Data Reviewed: I have personally reviewed following labs and imaging studies  CBC:  Recent Labs Lab 02/27/16 1340 02/28/16 0125 02/29/16 0522 03/01/16 0522 03/02/16 0511  WBC 14.4* 9.4 10.3 8.8 10.9*  NEUTROABS  --  6.7  --   --   --   HGB 12.0* 11.0* 11.1* 10.6* 11.1*  HCT 35.1* 34.2* 35.5* 33.9* 35.8*  MCV 71.4* 74.7* 75.1* 75.0* 75.2*  PLT 478* 393 436* 416* 409*    Basic Metabolic Panel:  Recent Labs Lab 02/27/16 1445 02/28/16 0125 02/29/16 0522 03/01/16 0522 03/02/16 0511 03/02/16 0946  NA 136 132* 136 135 138  --   K 3.1* 3.3* 4.1 3.4* 3.9  --   CL 96* 98* 99* 98* 100*  --   CO2 28 25 26 26 24   --   GLUCOSE 100* 143* 125* 173* 216*  --   BUN 24* 18 12 12 10   --   CREATININE 1.03 1.01 1.00 1.14 1.04  --  CALCIUM 9.1 8.3* 8.8* 8.5* 8.9  --   MG  --   --  1.3*  --   --  1.2*    GFR: Estimated Creatinine Clearance: 68.1 mL/min (by C-G formula based on SCr of 1.04 mg/dL).  Liver Function Tests:  Recent Labs Lab 02/27/16 1445 02/28/16 0125 03/02/16 0511  AST 38 38 24  ALT 35 37 27  ALKPHOS 257* 219* 225*  BILITOT 0.7 0.7 0.7  PROT 7.5 6.0* 6.5  ALBUMIN 2.6* 2.1* 2.1*     Recent Labs Lab 02/27/16 1445  LIPASE 18   Coagulation Profile:  Recent Labs Lab 02/28/16 0932  INR 1.17    Cardiac Enzymes:  Recent Labs Lab 02/27/16 1445  TROPONINI <0.03    CBG:  Recent Labs Lab 03/02/16 0606 03/02/16 1131 03/02/16 1643 03/02/16 2053 03/03/16 0615  GLUCAP 210* 195* 210* 162* 176*     Recent Results (from the past 240 hour(s))  Blood  culture (routine x 2)     Status: None   Collection Time: 02/27/16  6:35 PM  Result Value Ref Range Status   Specimen Description BLOOD LEFT ARM  Final   Special Requests AER 5 CC ANA 5CC  Final   Culture NO GROWTH 5 DAYS  Final   Report Status 03/03/2016 FINAL  Final  Blood culture (routine x 2)     Status: None   Collection Time: 02/27/16  6:40 PM  Result Value Ref Range Status   Specimen Description BLOOD RIGHT HAND  Final   Special Requests NONE  Final   Culture NO GROWTH 5 DAYS  Final   Report Status 03/03/2016 FINAL  Final  Urine culture     Status: Abnormal   Collection Time: 02/27/16  6:40 PM  Result Value Ref Range Status   Specimen Description URINE, CLEAN CATCH  Final   Special Requests NONE  Final   Culture >=100,000 COLONIES/mL ENTEROCOCCUS FAECALIS (A)  Final   Report Status 03/01/2016 FINAL  Final   Organism ID, Bacteria ENTEROCOCCUS FAECALIS (A)  Final      Susceptibility   Enterococcus faecalis - MIC*    AMPICILLIN <=2 SENSITIVE Sensitive     LEVOFLOXACIN 1 SENSITIVE Sensitive     NITROFURANTOIN <=16 SENSITIVE Sensitive     VANCOMYCIN 2 SENSITIVE Sensitive     * >=100,000 COLONIES/mL ENTEROCOCCUS FAECALIS  Aerobic/Anaerobic Culture (surgical/deep wound)     Status: None (Preliminary result)   Collection Time: 02/28/16  2:41 PM  Result Value Ref Range Status   Specimen Description WOUND ASPIRATE  Final   Special Requests DISC T11/T12  Final   Gram Stain   Final    FEW WBC PRESENT, PREDOMINANTLY PMN RARE GRAM POSITIVE COCCI IN PAIRS IN SINGLES    Culture   Final    FEW ENTEROCOCCUS FAECALIS NO ANAEROBES ISOLATED; CULTURE IN PROGRESS FOR 5 DAYS    Report Status PENDING  Incomplete   Organism ID, Bacteria ENTEROCOCCUS FAECALIS  Final      Susceptibility   Enterococcus faecalis - MIC*    AMPICILLIN <=2 SENSITIVE Sensitive     VANCOMYCIN 1 SENSITIVE Sensitive     GENTAMICIN SYNERGY SENSITIVE Sensitive     * FEW ENTEROCOCCUS FAECALIS      Radiology  Studies: No results found.   Medications:  Scheduled: . ampicillin-sulbactam (UNASYN) IV  3 g Intravenous Q6H  . aspirin EC  81 mg Oral Daily  . atorvastatin  80 mg Oral q1800  . insulin aspart  0-9  Units Subcutaneous TID WC  . insulin glargine  15 Units Subcutaneous Daily  . metoprolol  50 mg Oral BID  . mirtazapine  30 mg Oral QHS  . oxyCODONE-acetaminophen  2 tablet Oral QID  . pantoprazole  40 mg Oral Daily  . polyethylene glycol  17 g Oral Daily  . pregabalin  300 mg Oral BID  . risperiDONE  0.25 mg Oral BID  . senna  1 tablet Oral BID  . sertraline  100 mg Oral Daily  . vitamin B-12  1,000 mcg Oral Daily   Continuous:   [id number] **OR** acetaminophen, ALPRAZolam, bisacodyl, methocarbamol (ROBAXIN)  IV, morphine injection, ondansetron **OR** ondansetron (ZOFRAN) IV, oxyCODONE, zolpidem  Assessment/Plan:  Principal Problem:   Back pain Active Problems:   Diabetes mellitus type 2, controlled ([place])   T11 vertebral fracture (HCC)   Discitis of thoracic region   Essential hypertension   Discitis   Type 2 diabetes mellitus with complication, with long-term current use of insulin (HCC)    Acute T11-12 discitis with paraspinous abscess resulting in severe back pain/enterococcus  Aspirate from the spinal area is positive for enterococcus. This was also found on the urine culture as well. Blood cultures are negative. No evidence for neurological deficits. Patient was initially placed on vancomycin and Zosyn. ID was consulted. Patient has been transitioned to Unasyn. Appreciate ID input. He will need to be on Unasyn for one month followed by oral antibiotics for 4 weeks. PICC line to be placed. Patient's pain levels have improved this morning. Increase Percocet scheduled dosing to every 6 hours for now. Due to his severe pain. His condition was once again discussed with neurosurgery on 9/23. Discussed with Dr. Cyndy Freeze. 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 Dr. Cyndy Freeze reviewed his CT images and did mention that he did have severe infection and osteomyelitis on the scan. He did not feel that there was any fracture. He does not think that there is any surgical indication. He recommends pain control and a lumbar/thoracic brace. Continue with morphine as needed. PT and OT evaluation recommends placement to skilled nursing facility, which we agree with. Continue with laxatives.  Diabetes mellitus type 2 with episodes of hypoglycemia No further episodes of hypoglycemia. Dose of Lantus was adjusted yesterday. CBGs are better controlled. Continue to monitor for now. Holding his oral agents. Continue SSI.   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 One possible source could be the bladder tumor that was seen on the CT scan. Discussed with urology. Appreciate urology input. Plan is for outpatient follow-up and management. No need for any intervention at this time. Urology also felt based on the scan that perhaps patient had some evidence for pyelonephritis on the CT scan.  Possible pneumonia No definite signs of pneumonia clinically. Patient is on empiric antibiotics.  Essential Hypertension Continue metoprolol. Holding HCTZ for now. Blood pressures are reasonably well controlled. High readings of blood pressures are most likely secondary to pain.  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 No evidence for overt bleeding. Anemia panel reviewed. He does have vitamin B-12 deficiency. Ferritin is 286. Folate is 12.7. Started vitamin B-12 supplementation.  Hyperlipidemia On statins  Sludge versus stones in the gallbladder. Incidentally identified on CT scan. His LFTs are normal. Lipase was normal. Outpatient follow-up.  DVT Prophylaxis: SCDs    Code Status: Full code  Family Communication: Discussed with the patient.  Discussed  with his son, Zenia Resides, 9/22. Called today as well , but unable to reach. Disposition Plan: Management as outlined above. Patient will need to go to skilled nursing facility for rehabilitation. Anticipate discharge in 1-2 days depending on how his pain is doing.    LOS: 5 days   [facility] Pager [phone] 03/03/2016, 9:41 AM  If 7PM-7AM, please contact night-coverage at [email or web address], password [facility]

## 2016-03-04 LAB — BASIC METABOLIC PANEL
ANION GAP: 10 (ref 5–15)
BUN: 11 mg/dL (ref 6–20)
CHLORIDE: 102 mmol/L (ref 101–111)
CO2: 25 mmol/L (ref 22–32)
Calcium: 8.6 mg/dL — ABNORMAL LOW (ref 8.9–10.3)
Creatinine, Ser: 0.89 mg/dL (ref 0.61–1.24)
GFR calc Af Amer: >60 mL/min (ref >60)
GLUCOSE: 136 mg/dL — AB (ref 65–99)
POTASSIUM: 3.8 mmol/L (ref 3.5–5.1)
Sodium: 137 mmol/L (ref 135–145)

## 2016-03-04 LAB — AEROBIC/ANAEROBIC CULTURE (SURGICAL/DEEP WOUND)

## 2016-03-04 LAB — GLUCOSE, CAPILLARY
GLUCOSE-CAPILLARY: 151 mg/dL — AB (ref 65–99)
Glucose-Capillary: 144 mg/dL — ABNORMAL HIGH (ref 65–99)
Glucose-Capillary: 171 mg/dL — ABNORMAL HIGH (ref 65–99)
Glucose-Capillary: 137 mg/dL — ABNORMAL HIGH (ref 65–99)

## 2016-03-04 LAB — CBC
HEMATOCRIT: 31.4 % — AB (ref 39.0–52.0)
HEMOGLOBIN: 9.9 g/dL — AB (ref 13.0–17.0)
MCH: 23.7 pg — AB (ref 26.0–34.0)
MCHC: 31.5 g/dL (ref 30.0–36.0)
MCV: 75.3 fL — AB (ref 78.0–100.0)
Platelets: 376 10*3/uL (ref 150–400)
RBC: 4.17 MIL/uL — ABNORMAL LOW (ref 4.22–5.81)
RDW: 17.7 % — AB (ref 11.5–15.5)
WBC: 9.3 10*3/uL (ref 4.0–10.5)

## 2016-03-04 LAB — AEROBIC/ANAEROBIC CULTURE W GRAM STAIN (SURGICAL/DEEP WOUND)

## 2016-03-04 MED ORDER — MORPHINE SULFATE (PF) 2 MG/ML IV SOLN
2.0000 mg | INTRAVENOUS | Status: DC | PRN
  Filled 2016-03-04: qty 1

## 2016-03-04 MED ORDER — OXYCODONE HCL 5 MG PO TABS
15.0000 mg | ORAL_TABLET | ORAL | Status: DC | PRN
  Administered 2016-03-04 – 2016-03-05 (×6): 15 mg via ORAL
  Filled 2016-03-04 (×7): qty 3

## 2016-03-04 NOTE — Progress Notes (Signed)
PT Cancellation Note  Patient Details Name: Axiel Shrewsbury MRN: [id number] DOB: Oct 14, 1940   Cancelled Treatment:    Reason Eval/Treat Not Completed: Fatigue/lethargy limiting ability to participate;Pain limiting ability to participate.  Pt indicates feeling "rough" today due to pain and fatigue.  Pt request hold PT today.  Will f/u as appropriate.     Edouard Gikas, Thornton Papas 03/04/2016, 1:12 PM

## 2016-03-04 NOTE — Progress Notes (Signed)
[facility] PROGRESS NOTE  Dennis Haynes [id number] DOB: 1940-12-16 DOA: 02/27/2016  PCP: Marygrace Drought, MD  Brief History/Interval Summary: 75 year old Caucasian male with a past medical history of diabetes mellitus type 2, essential hypertension, chronic back pain, hyperlipidemia was entered with complaints of worsening back pain. Patient also was complaining of abdominal pain. He underwent a CT scan of the abdomen and pelvis. This was done at [facility]. CT scan revealed findings suggestive of acute discitis at T11-12 with an associated T11 inferior endplate fracture. Question small early right paraspinous abscess at T11/12 measuring 18 x 9 mm. Patient was hospitalized for further management. Patient underwent the disc aspiration. Based on the culture reports. Antibiotics were changed.  Reason for Visit: Acute discitis with abscess  Consultants: Phone discussion with neurosurgery, Dr. Ellene Route. Interventional radiology. Phone discussion with Dr. Christella Noa on 9/23. Dr. Johnnye Sima with infectious disease.  Procedures: Disc aspiration by IR 9/20  Antibiotics: Vancomycin and Zosyn 9/20-9/23 Unasyn 9/23  Subjective/Interval History: Patient states that his pain is worse. However, he looks more comfortable than he has previously. No other complaints offered.   ROS: No chest pain or shortness of breath.  Objective:  Vital Signs  Vitals:   03/03/16 0410 03/03/16 1300 03/03/16 2130 03/04/16 0505  BP: 134/77 (!) 150/82 (!) 137/57 (!) 166/59  Pulse: 63 63 77 73  Resp: 18  18 18   Temp: 97.9 F (36.6 C) 98 F (36.7 C) 98.7 F (37.1 C) 98.2 F (36.8 C)  TempSrc: Oral Oral Oral Oral  SpO2: 96% 96% 98% 99%  Weight:      Height:        Intake/Output Summary (Last 24 hours) at 03/04/16 0919 Last data filed at 03/04/16 0710  Gross per 24 hour  Intake              930 ml  Output              725 ml  Net              205 ml    General appearance: Awake,  alert. Resp: clear to auscultation bilaterally Cardio: regular rate and rhythm, S1, S2 normal, no murmur, click, rub or gallop GI: soft, non-tender; bowel sounds normal; no masses,  no organomegaly Back: No obvious deformity noted. No bruising. Neurologic: Awake and alert. Oriented 3. No cranial nerve deficits. Strength is equal bilateral upper extremities. Able to move both his lower extremities.   Lab Results:  Data Reviewed: I have personally reviewed following labs and imaging studies  CBC:  Recent Labs Lab 02/28/16 0125 02/29/16 0522 03/01/16 0522 03/02/16 0511 03/04/16 0500  WBC 9.4 10.3 8.8 10.9* 9.3  NEUTROABS 6.7  --   --   --   --   HGB 11.0* 11.1* 10.6* 11.1* 9.9*  HCT 34.2* 35.5* 33.9* 35.8* 31.4*  MCV 74.7* 75.1* 75.0* 75.2* 75.3*  PLT 393 436* 416* 409* [id number]    Basic Metabolic Panel:  Recent Labs Lab 02/28/16 0125 02/29/16 0522 03/01/16 0522 03/02/16 0511 03/02/16 0946 03/04/16 0500  NA 132* 136 135 138  --  137  K 3.3* 4.1 3.4* 3.9  --  3.8  CL 98* 99* 98* 100*  --  102  CO2 25 26 26 24   --  25  GLUCOSE 143* 125* 173* 216*  --  136*  BUN 18 12 12 10   --  11  CREATININE 1.01 1.00 1.14 1.04  --  0.89  CALCIUM 8.3* 8.8* 8.5*  8.9  --  8.6*  MG  --  1.3*  --   --  1.2*  --     GFR: Estimated Creatinine Clearance: 79.6 mL/min (by C-G formula based on SCr of 0.89 mg/dL).  Liver Function Tests:  Recent Labs Lab 02/27/16 1445 02/28/16 0125 03/02/16 0511  AST 38 38 24  ALT 35 37 27  ALKPHOS 257* 219* 225*  BILITOT 0.7 0.7 0.7  PROT 7.5 6.0* 6.5  ALBUMIN 2.6* 2.1* 2.1*     Recent Labs Lab 02/27/16 1445  LIPASE 18   Coagulation Profile:  Recent Labs Lab 02/28/16 0932  INR 1.17    Cardiac Enzymes:  Recent Labs Lab 02/27/16 1445  TROPONINI <0.03    CBG:  Recent Labs Lab 03/03/16 0615 03/03/16 1228 03/03/16 1614 03/03/16 2134 03/04/16 0610  GLUCAP 176* 216* 196* 194* 144*     Recent Results (from the past 240  hour(s))  Blood culture (routine x 2)     Status: None   Collection Time: 02/27/16  6:35 PM  Result Value Ref Range Status   Specimen Description BLOOD LEFT ARM  Final   Special Requests AER 5 CC ANA 5CC  Final   Culture NO GROWTH 5 DAYS  Final   Report Status 03/03/2016 FINAL  Final  Blood culture (routine x 2)     Status: None   Collection Time: 02/27/16  6:40 PM  Result Value Ref Range Status   Specimen Description BLOOD RIGHT HAND  Final   Special Requests NONE  Final   Culture NO GROWTH 5 DAYS  Final   Report Status 03/03/2016 FINAL  Final  Urine culture     Status: Abnormal   Collection Time: 02/27/16  6:40 PM  Result Value Ref Range Status   Specimen Description URINE, CLEAN CATCH  Final   Special Requests NONE  Final   Culture >=100,000 COLONIES/mL ENTEROCOCCUS FAECALIS (A)  Final   Report Status 03/01/2016 FINAL  Final   Organism ID, Bacteria ENTEROCOCCUS FAECALIS (A)  Final      Susceptibility   Enterococcus faecalis - MIC*    AMPICILLIN <=2 SENSITIVE Sensitive     LEVOFLOXACIN 1 SENSITIVE Sensitive     NITROFURANTOIN <=16 SENSITIVE Sensitive     VANCOMYCIN 2 SENSITIVE Sensitive     * >=100,000 COLONIES/mL ENTEROCOCCUS FAECALIS  Aerobic/Anaerobic Culture (surgical/deep wound)     Status: None (Preliminary result)   Collection Time: 02/28/16  2:41 PM  Result Value Ref Range Status   Specimen Description WOUND ASPIRATE  Final   Special Requests DISC T11/T12  Final   Gram Stain   Final    FEW WBC PRESENT, PREDOMINANTLY PMN RARE GRAM POSITIVE COCCI IN PAIRS IN SINGLES    Culture   Final    FEW ENTEROCOCCUS FAECALIS NO ANAEROBES ISOLATED; CULTURE IN PROGRESS FOR 5 DAYS    Report Status PENDING  Incomplete   Organism ID, Bacteria ENTEROCOCCUS FAECALIS  Final      Susceptibility   Enterococcus faecalis - MIC*    AMPICILLIN <=2 SENSITIVE Sensitive     VANCOMYCIN 1 SENSITIVE Sensitive     GENTAMICIN SYNERGY SENSITIVE Sensitive     * FEW ENTEROCOCCUS FAECALIS       Radiology Studies: No results found.   Medications:  Scheduled: . ampicillin-sulbactam (UNASYN) IV  3 g Intravenous Q6H  . aspirin EC  81 mg Oral Daily  . atorvastatin  80 mg Oral q1800  . insulin aspart  0-9 Units Subcutaneous TID WC  .  insulin glargine  15 Units Subcutaneous Daily  . metoprolol  50 mg Oral BID  . mirtazapine  30 mg Oral QHS  . oxyCODONE-acetaminophen  2 tablet Oral QID  . pantoprazole  40 mg Oral Daily  . polyethylene glycol  17 g Oral Daily  . pregabalin  300 mg Oral BID  . risperiDONE  0.25 mg Oral BID  . senna  1 tablet Oral BID  . sertraline  100 mg Oral Daily  . sodium chloride flush  10-40 mL Intracatheter Q12H  . vitamin B-12  1,000 mcg Oral Daily   Continuous:   [id number] **OR** acetaminophen, ALPRAZolam, bisacodyl, methocarbamol (ROBAXIN)  IV, morphine injection, ondansetron **OR** ondansetron (ZOFRAN) IV, oxyCODONE, sodium chloride flush, zolpidem  Assessment/Plan:  Principal Problem:   Back pain Active Problems:   Diabetes mellitus type 2, controlled ([place])   T11 vertebral fracture (HCC)   Discitis of thoracic region   Essential hypertension   Discitis   Type 2 diabetes mellitus with complication, with long-term current use of insulin (HCC)    Acute T11-12 discitis with paraspinous abscess resulting in severe back pain/enterococcus  Aspirate from the spinal area is positive for enterococcus. This was also found on the urine culture as well. Blood cultures are negative. No evidence for neurological deficits. Patient was initially placed on vancomycin and Zosyn. ID was consulted. Patient has been transitioned to Unasyn. Appreciate ID input. He will need to be on Unasyn for one month followed by oral antibiotics for 4 weeks. PICC line was placed. Continue pain control regimen. Patient told to take oral education for breakthrough pain rather than intravenous. Discussed also with the nursing staff. Yesterday, his Percocet was changed to  4 times a day on a scheduled basis. Patient remains neurologically intact. Due to his severe pain his condition was once again discussed with neurosurgery on 9/23. Discussed with Dr. Christella Noa. 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 Dr. Cyndy Freeze reviewed his CT images and did mention that he did have severe infection and osteomyelitis on the scan. He did not feel that there was any fracture. He does not think that there is any surgical indication. He recommends pain control and a lumbar/thoracic brace. Continue with morphine as needed. PT and OT evaluation recommends placement to skilled nursing facility, which we agree with. Continue with laxatives.  Diabetes mellitus type 2 with episodes of hypoglycemia No further episodes of hypoglycemia. Dose of Lantus was adjusted yesterday. CBGs are better controlled. Continue to monitor for now. Holding his oral agents. Continue SSI.   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 One possible source could be the bladder tumor that was seen on the CT scan. Discussed with urology. Appreciate urology input. Plan is for outpatient follow-up and management. No need for any intervention at this time. Urology also felt based on the scan that perhaps patient had some evidence for pyelonephritis on the CT scan.  Possible pneumonia No definite signs of pneumonia clinically. Pneumonia is ruled out.  Essential Hypertension Continue metoprolol. Holding HCTZ for now. Blood pressures are reasonably well controlled. High readings of blood pressures are most likely secondary to pain.  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 No evidence for overt bleeding. Anemia panel reviewed. He does have vitamin B-12 deficiency. Ferritin is 286. Folate is 12.7. Started vitamin B-12 supplementation.  Hyperlipidemia On statins  Sludge  versus stones in the gallbladder. Incidentally identified on CT scan.  His LFTs are normal. Lipase was normal. Outpatient follow-up.  DVT Prophylaxis: SCDs    Code Status: Full code  Family Communication: Discussed with the patient. Discussed with his son, Zenia Resides, 9/24.  Disposition Plan: Management as outlined above. Patient will need to go to skilled nursing facility for rehabilitation. Anticipate discharge tomorrow.    LOS: 6 days   [facility] Pager [phone] 03/04/2016, 9:19 AM  If 7PM-7AM, please contact night-coverage at [email or web address], password [facility]

## 2016-03-04 NOTE — Progress Notes (Signed)
-  SW spoke with pt at bedside. There was no family present. Patient was alert and oriented. SW made pt aware of facilities who offered him a bed. Patient states that he would like to accept the offer from [facility].  SW reached out to admissions to inform them that the patient has decided to accept their offer. However, they did not answer the phone. SW left a message requesting a call back.   Tilda Burrow, MSW  [phone]

## 2016-03-05 LAB — GLUCOSE, CAPILLARY
GLUCOSE-CAPILLARY: 150 mg/dL — AB (ref 65–99)
Glucose-Capillary: 129 mg/dL — ABNORMAL HIGH (ref 65–99)

## 2016-03-05 MED ORDER — OXYCODONE-ACETAMINOPHEN 10-325 MG PO TABS: 1.0000 | ORAL_TABLET | Freq: Four times a day (QID) | ORAL | 0 refills | Status: DC

## 2016-03-05 MED ORDER — CYANOCOBALAMIN 1000 MCG PO TABS: 1000.0000 ug | ORAL_TABLET | Freq: Every day | ORAL | Status: DC

## 2016-03-05 MED ORDER — ACETAMINOPHEN 325 MG PO TABS: 650.0000 mg | ORAL_TABLET | Freq: Four times a day (QID) | ORAL | Status: AC | PRN

## 2016-03-05 MED ORDER — RISPERIDONE 0.25 MG PO TABS: 0.2500 mg | ORAL_TABLET | Freq: Two times a day (BID) | ORAL | 0 refills | Status: AC

## 2016-03-05 MED ORDER — AMPICILLIN-SULBACTAM SODIUM 3 (2-1) G IJ SOLR: 3.0000 g | Freq: Four times a day (QID) | INTRAMUSCULAR | Status: DC

## 2016-03-05 MED ORDER — SENNA 8.6 MG PO TABS: 1.0000 | ORAL_TABLET | Freq: Two times a day (BID) | ORAL | 0 refills | Status: DC

## 2016-03-05 MED ORDER — INSULIN DETEMIR 100 UNIT/ML FLEXPEN: 18.0000 [IU] | PEN_INJECTOR | Freq: Every day | SUBCUTANEOUS | 11 refills | Status: DC

## 2016-03-05 MED ORDER — POLYETHYLENE GLYCOL 3350 17 G PO PACK: 17.0000 g | PACK | Freq: Two times a day (BID) | ORAL | 0 refills | Status: AC

## 2016-03-05 MED ORDER — ZOLPIDEM TARTRATE 5 MG PO TABS: 5.0000 mg | ORAL_TABLET | Freq: Every evening | ORAL | 0 refills | Status: DC | PRN

## 2016-03-05 MED ORDER — ALPRAZOLAM 0.5 MG PO TABS: 0.5000 mg | ORAL_TABLET | Freq: Three times a day (TID) | ORAL | 0 refills | Status: AC | PRN

## 2016-03-05 MED ORDER — MIRTAZAPINE 30 MG PO TABS: 30.0000 mg | ORAL_TABLET | Freq: Every day | ORAL | 0 refills | Status: DC

## 2016-03-05 MED ORDER — BISACODYL 10 MG RE SUPP: 10.0000 mg | Freq: Every day | RECTAL | 0 refills | Status: AC | PRN

## 2016-03-05 MED ORDER — HEPARIN SOD (PORK) LOCK FLUSH 100 UNIT/ML IV SOLN
250.0000 [IU] | INTRAVENOUS | Status: AC | PRN
  Administered 2016-03-05: 250 [IU]

## 2016-03-05 MED ORDER — OXYCODONE HCL 15 MG PO TABS
15.0000 mg | ORAL_TABLET | ORAL | 0 refills | Status: DC | PRN
Start: 2016-03-05 — End: 2016-04-10

## 2016-03-05 MED ORDER — RISPERIDONE 0.25 MG PO TABS: 0.2500 mg | ORAL_TABLET | Freq: Two times a day (BID) | ORAL | 0 refills | Status: DC

## 2016-03-05 NOTE — Care Management Important Message (Signed)
Important Message  Patient Details  Name: Dennis Haynes MRN: [id number] Date of Birth: 1940-09-22   Medicare Important Message Given:  Yes    Baltasar Twilley Montine Circle 03/05/2016, 10:56 AM

## 2016-03-05 NOTE — Clinical Social Work Placement (Signed)
   CLINICAL SOCIAL WORK PLACEMENT  NOTE  Date:  03/05/2016  Patient Details  Name: Dennis Haynes MRN: [id number] Date of Birth: 26-Nov-1940  Clinical Social Work is seeking post-discharge placement for this patient at the [place] level of care (*CSW will initial, date and re-position this form in  chart as items are completed):  Yes   Patient/family provided with [place] Work Department's list of facilities offering this level of care within the geographic area requested by the patient (or if unable, by the patient's family).  Yes   Patient/family informed of their freedom to choose among providers that offer the needed level of care, that participate in Medicare, Medicaid or managed care program needed by the patient, have an available bed and are willing to accept the patient.  Yes   Patient/family informed of [facility]'s ownership interest in [facility] and [facility], as well as of the fact that they are under no obligation to receive care at these facilities.  PASRR submitted to EDS on       PASRR number received on       Existing PASRR number confirmed on       FL2 transmitted to all facilities in geographic area requested by pt/family on 03/03/16     FL2 transmitted to all facilities within larger geographic area on       Patient informed that his/her managed care company has contracts with or will negotiate with certain facilities, including the following:        Yes   Patient/family informed of bed offers received.  Patient chooses bed at  ([place])     Physician recommends and patient chooses bed at      Patient to be transferred to  [facility]) on 03/05/16.  Patient to be transferred to facility by  [facility])     Patient family notified on 03/05/16 of transfer.  Name of family member notified:        PHYSICIAN       Additional Comment:     _______________________________________________ Tilda Burrow R 03/05/2016, 11:10 AM

## 2016-03-05 NOTE — Discharge Summary (Signed)
[facility]  Physician Discharge Summary   Patient ID: Dennis Haynes MRN: [id number] DOB/AGE: 12-16-1940 75 y.o.  Admit date: 02/27/2016 Discharge date: 03/05/2016  PCP: Marygrace Drought, MD  DISCHARGE DIAGNOSES:  Principal Problem:   Back pain Active Problems:   Diabetes mellitus type 2, controlled ([place])   T11 vertebral fracture (HCC)   Discitis of thoracic region   Essential hypertension   Discitis   Type 2 diabetes mellitus with complication, with long-term current use of insulin (HCC)   RECOMMENDATIONS FOR OUTPATIENT FOLLOW UP: 1. Unasyn for one month followed by Augmentin for 4 weeks 2. Monitor CBGs. 3. CBC and basic metabolic panel in [redacted] week along with CRP and ESR   DISCHARGE CONDITION: fair  Diet recommendation: Modified carbohydrate  Filed Weights   03/02/16 0846  Weight: 89.7 kg (197 lb 11.2 oz)    INITIAL HISTORY: 75 year old Caucasian male with a past medical history of diabetes mellitus type 2, essential hypertension, chronic back pain, hyperlipidemia was entered with complaints of worsening back pain. Patient also was complaining of abdominal pain. He underwent a CT scan of the abdomen and pelvis. This was done at [facility]. CT scan revealed findings suggestive of acute discitis at T11-12 with an associated T11 inferior endplate fracture. Question small early right paraspinous abscess at T11/12 measuring 18 x 9 mm. Patient was hospitalized for further management. Patient underwent the disc aspiration. Based on the culture reports. Antibiotics were changed.  Consultants:  Phone discussion with neurosurgery, Dr. Ellene Route at the time of admission.  Interventional radiology.  Phone discussion with Dr. Christella Noa on 9/23.  Dr. Johnnye Sima with infectious disease.  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 He was seen by interventional radiology and underwent disc aspiration. Aspirate from the spinal area is positive for enterococcus. This was also found on the urine culture as well. Blood cultures are negative. No evidence for neurological deficits. Patient was initially placed on vancomycin and Zosyn. ID was consulted. Patient has been transitioned to Unasyn. He will need to be on Unasyn for one month followed by oral antibiotics for 4 weeks. PICC line was placed. Continue pain control regimen. Due to his severe pain his condition was once again discussed with neurosurgery on 9/23. Discussed with Dr. Christella Noa. 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 Dr. Christella Noa reviewed his CT images and did mention that he did have severe infection and osteomyelitis on the scan. He did not feel that there was any fracture. He does not think that there is any surgical indication. He recommends pain control and a lumbar/thoracic brace. PT and OT evaluation recommends placement to skilled nursing facility, which we agree with. Continue with laxatives. Follow-up with infectious disease in 4 weeks. CRP was 6.2. ESR 71. These can be monitored on a weekly basis at the skilled nursing facility.  Diabetes mellitus type 2 with episodes of hypoglycemia CBGs have been stable. Patient did have a few episodes of hypoglycemia in the hospital. Dose of Lantus has been adjusted. Continue to monitor CBGs. Sliding scale coverage per SNF. HbA1c was 9.8.   Enterococcus UTI/pyelonephritis withbladder tumorper CT scan Urine culture is positive for enterococcus. One possible source could be the bladder tumor that was seen on the CT scan. Discussed with urology, Dr. Tresa Moore. Plan is for outpatient  follow-up and management. No need for any intervention at this time. Urology also felt based on the scan that perhaps patient had some  evidence for pyelonephritis on the CT scan. Please set up appointment to see Dr. Tresa Moore in the [place] office in a few weeks time.  Possible pneumonia No definite signs of pneumonia clinically. Pneumonia is ruled out.  Essential Hypertension Continue home medications. Blood pressures have been elevated due to pain issues.   Hypokalemia and hypomagnesemia Repleted  Chronic anemia, microcytic/vitamin B-12 deficiency Continue to monitor hemoglobin. No evidence for overt bleeding. Anemia panel reviewed. He does have vitamin B-12 deficiency. Ferritin is 286. Folate is 12.7. Started vitamin B-12 supplementation. Monitor CBC periodically.  Hyperlipidemia On statins  Sludge versus stones in the gallbladder. Incidentally identified on CT scan. His LFTs are normal. Lipase was normal. Outpatient follow-up.  Overall, stable. Pain still remains significant. However, patient appears to be much more comfortable than what he was when initially evaluated. He is okay for discharge to SNF.   PERTINENT LABS:  The results of significant diagnostics from this hospitalization (including imaging, microbiology, ancillary and laboratory) are listed below for reference.    Microbiology: Recent Results (from the past 240 hour(s))  Blood culture (routine x 2)     Status: None   Collection Time: 02/27/16  6:35 PM  Result Value Ref Range Status   Specimen Description BLOOD LEFT ARM  Final   Special Requests AER 5 CC ANA 5CC  Final   Culture NO GROWTH 5 DAYS  Final   Report Status 03/03/2016 FINAL  Final  Blood culture (routine x 2)     Status: None   Collection Time: 02/27/16  6:40 PM  Result Value Ref Range Status   Specimen Description BLOOD RIGHT HAND  Final   Special Requests NONE  Final   Culture NO GROWTH 5 DAYS  Final   Report Status 03/03/2016 FINAL  Final  Urine culture     Status: Abnormal   Collection Time: 02/27/16  6:40 PM  Result Value Ref Range Status   Specimen Description  URINE, CLEAN CATCH  Final   Special Requests NONE  Final   Culture >=100,000 COLONIES/mL ENTEROCOCCUS FAECALIS (A)  Final   Report Status 03/01/2016 FINAL  Final   Organism ID, Bacteria ENTEROCOCCUS FAECALIS (A)  Final      Susceptibility   Enterococcus faecalis - MIC*    AMPICILLIN <=2 SENSITIVE Sensitive     LEVOFLOXACIN 1 SENSITIVE Sensitive     NITROFURANTOIN <=16 SENSITIVE Sensitive     VANCOMYCIN 2 SENSITIVE Sensitive     * >=100,000 COLONIES/mL ENTEROCOCCUS FAECALIS  Aerobic/Anaerobic Culture (surgical/deep wound)     Status: None   Collection Time: 02/28/16  2:41 PM  Result Value Ref Range Status   Specimen Description WOUND ASPIRATE  Final   Special Requests DISC T11/T12  Final   Gram Stain   Final    FEW WBC PRESENT, PREDOMINANTLY PMN RARE GRAM POSITIVE COCCI IN PAIRS IN SINGLES    Culture FEW ENTEROCOCCUS FAECALIS NO ANAEROBES ISOLATED   Final   Report Status 03/04/2016 FINAL  Final   Organism ID, Bacteria ENTEROCOCCUS FAECALIS  Final      Susceptibility   Enterococcus faecalis - MIC*    AMPICILLIN <=2 SENSITIVE Sensitive     VANCOMYCIN 1 SENSITIVE Sensitive     GENTAMICIN SYNERGY SENSITIVE Sensitive     * FEW ENTEROCOCCUS FAECALIS     Labs: Basic Metabolic Panel:  Recent Labs Lab 02/28/16  1982 02/29/16 0522 03/01/16 0522 03/02/16 0511 03/02/16 0946 03/04/16 0500  NA 132* 136 135 138  --  137  K 3.3* 4.1 3.4* 3.9  --  3.8  CL 98* 99* 98* 100*  --  102  CO2 _0 --  25  GLUCOSE 143* 125* 173* 216*  --  136*  BUN _1 --  11  CREATININE 1.01 1.00 1.14 1.04  --  0.89  CALCIUM 8.3* 8.8* 8.5* 8.9  --  8.6*  MG  --  1.3*  --   --  1.2*  --    Liver Function Tests:  Recent Labs Lab 02/27/16 1445 02/28/16 0125 03/02/16 0511  AST 38 38 24  ALT 35 37 27  ALKPHOS 257* 219* 225*  BILITOT 0.7 0.7 0.7  PROT 7.5 6.0* 6.5  ALBUMIN 2.6* 2.1* 2.1*    Recent Labs Lab 02/27/16 1445  LIPASE 18   No results for input(s): AMMONIA in the  last 168 hours. CBC:  Recent Labs Lab 02/28/16 0125 02/29/16 0522 03/01/16 0522 03/02/16 0511 03/04/16 0500  WBC 9.4 10.3 8.8 10.9* 9.3  NEUTROABS 6.7  --   --   --   --   HGB 11.0* 11.1* 10.6* 11.1* 9.9*  HCT 34.2* 35.5* 33.9* 35.8* 31.4*  MCV 74.7* 75.1* 75.0* 75.2* 75.3*  PLT 393 436* 416* 409* 376   Cardiac Enzymes:  Recent Labs Lab 02/27/16 1445  TROPONINI <0.03   CBG:  Recent Labs Lab 03/04/16 0610 03/04/16 1146 03/04/16 1643 03/04/16 2207 03/05/16 0738  GLUCAP 144* 151* 171* 137* 150*     IMAGING STUDIES Dg Chest 2 View  Result Date: 02/27/2016 CLINICAL DATA:  Increasing confusion. EXAM: CHEST  2 VIEW COMPARISON:  02/05/2016. FINDINGS: Cardiomegaly. Asymmetric opacity at the RIGHT base, with elevated hemidiaphragm, not present previously, suspicious for early pneumonia. Old healed BILATERAL rib fractures. No pneumothorax. Calcified tortuous aorta. IMPRESSION: Cardiomegaly.  RIGHT lower lobe infiltrate not excluded. Electronically Signed   By: Staci Righter M.D.   On: 02/27/2016 15:57   Dg Chest 2 View  Result Date: 02/05/2016 CLINICAL DATA:  Weakness EXAM: CHEST  2 VIEW COMPARISON:  03/10/2012 thoracic spine radiographs FINDINGS: Atherosclerotic aortic arch.  Heart size within normal limits. Thoracic spondylosis. Right posterolateral sixth rib deformities suggesting old fracture. Old left healed rib fractures noted laterally. No pleural effusion. IMPRESSION: 1. Old bilateral healed rib fractures. Thoracic spondylosis. No acute findings. 2. Atherosclerotic calcification of the aortic arch. Electronically Signed   By: Van Clines M.D.   On: 02/05/2016 14:28   Ct Abdomen Pelvis W Contrast  Result Date: 02/27/2016 CLINICAL DATA:  Recent multiple falls, chronic abdominal pain, worse in the left upper quadrant. Confusion. EXAM: CT ABDOMEN AND PELVIS WITH CONTRAST TECHNIQUE: Multidetector CT imaging of the abdomen and pelvis was performed using the standard  protocol following bolus administration of intravenous contrast. CONTRAST:  166m ISOVUE-300 IOPAMIDOL (ISOVUE-300) INJECTION 61% COMPARISON:  02/27/2016 chest x-ray FINDINGS: Lower chest: Minor right base dependent atelectasis. Lung bases otherwise clear. No pericardial or pleural effusion. Native coronary atherosclerosis. Lower thoracic aortic atherosclerosis as well. Hepatobiliary: No focal hepatic abnormality or intrahepatic biliary dilatation. Patent portal vein. Layering slight hyper attenuation within the gallbladder, suspicious for gallstones or sludge. Pancreas: Very thin in appearance, mildly atrophic. No focal abnormality or ductal dilatation. Spleen: Normal in size without focal abnormality. Adrenals/Urinary Tract: Normal adrenal glands. Scattered renal hypodense cysts larger on the left. Largest left renal cyst  measures 28 mm, image 49. No renal obstruction or hydronephrosis. No ureteral dilatation or ureteral calculus. Bladder demonstrates a focal right posterior wall nodule/mass measuring 2.5 cm. Recommend follow-up nonemergent urologic consultation for cystoscopy evaluation. Stomach/Bowel: Negative for bowel obstruction, significant dilatation, ileus, or free air. Appendix not visualized. No fluid collection or abscess. No ascites. Vascular/Lymphatic: Extensive aortoiliac atherosclerosis without aneurysm or occlusive process. No retroperitoneal hemorrhage or hematoma. No adenopathy. Reproductive: Prostate normal in size. Seminal vesicles symmetric. Penile prosthesis noted. Other: No abdominal wall hernia or abnormality. No abdominopelvic ascites. Musculoskeletal: T11-12 surrounding paraspinous soft tissue abnormality with edema and enhancement. Endplate irregularity at T11 and T12. Appearance compatible with T11-12 spondylo discitis. There is associated inferior endplate fracture at V43. No severe compression deformity or focal kyphosis. By CT, there is the suggestion of a small right paraspinous  fluid collection measuring 18 x 9 mm, image 22. This is concerning for paraspinous abscess. Diffuse degenerative changes of the lumbar spine with vacuum disc phenomena at L5. IMPRESSION: CT findings concerning for acute spondylo discitis at T11-12 with an associated T11 inferior endplate fracture. Question early small right paraspinous abscess at T11-12 measuring 18 x 9 mm. Consider further evaluation with MRI without and with contrast. Coronary and diffuse thoracoabdominal aortic atherosclerosis Layering sludge versus stones in the gallbladder Incidental renal cysts 2.5 cm right posterior bladder nodular mass warrants urologic follow-up for cystoscopy. These results were called by telephone at the time of interpretation on 02/27/2016 at 5:27 pm to Dr. Eula Listen , who verbally acknowledged these results. Electronically Signed   By: Jerilynn Mages.  Shick M.D.   On: 02/27/2016 17:29   Ir Fluoro Guide Ndl Plmt / Bx  Result Date: 02/28/2016 INDICATION: Severe back pain.  CT suggests discitis/osteomyelitis T11-12. EXAM: IR FLUORO GUIDE NEEDLE PLACEMENT /BIOPSY MEDICATIONS: Lidocaine 1% subcutaneous 10 mL ANESTHESIA/SEDATION: Intravenous Fentanyl and Versed were administered as conscious sedation during continuous monitoring of the patient's level of consciousness and physiological / cardiorespiratory status by the radiology RN, with a total moderate sedation time of 20 minutes. PROCEDURE: Informed written consent was obtained from the patient after a thorough discussion of the procedural risks, benefits and alternatives. All questions were addressed. Maximal Sterile Barrier Technique was utilized including caps, mask, sterile gowns, sterile gloves, sterile drape, hand hygiene and skin antiseptic. A timeout was performed prior to the initiation of the procedure. The T11-12 disc was localized and correlated to previous imaging. An appropriate skin entry site was determined and marked. Site was marked, prepped with  Betadine, draped in usual sterile fashion, infiltrated locally with 1% lidocaine. Under intermittent fluoroscopic guidance, a 16 gauge trocar needle was advanced into the interspace from a left parasagittal approach. Needle tip position confirmed in 3 projections. 5 mL of purulent material were aspirated, sent for Gram stain, culture and sensitivity. The needle was removed. The patient tolerated the procedure well. FLUOROSCOPY TIME:  30 seconds, 15 mGy COMPLICATIONS: None immediate. IMPRESSION: 1. Technically successful thoracic T11-12 disc aspiration under fluoroscopy, returning 5 mL purulent fluid, sent for Gram stain, culture and sensitivity. Electronically Signed   By: Lucrezia Europe M.D.   On: 02/28/2016 14:58    DISCHARGE EXAMINATION: Vitals:   03/04/16 1500 03/04/16 2128 03/04/16 2203 03/05/16 0603  BP: (!) 145/68 (!) 137/101 (!) 159/72 (!) 157/70  Pulse: 63 88 76 77  Resp:  _0 Temp: 97.4 F (36.3 C)  98 F (36.7 C) 98.3 F (36.8 C)  TempSrc: Oral  Oral Oral  SpO2: 99% 100%  96% 93%  Weight:      Height:       General appearance: alert, cooperative, appears stated age and no distress Resp: clear to auscultation bilaterally Cardio: regular rate and rhythm, S1, S2 normal, no murmur, click, rub or gallop GI: soft, non-tender; bowel sounds normal; no masses,  no organomegaly Extremities: extremities normal, atraumatic, no cyanosis or edema  DISPOSITION: SNF  Discharge Instructions    Call MD for:  difficulty breathing, headache or visual disturbances    Complete by:  As directed    Call MD for:  extreme fatigue    Complete by:  As directed    Call MD for:  persistant dizziness or light-headedness    Complete by:  As directed    Call MD for:  persistant nausea and vomiting    Complete by:  As directed    Call MD for:  severe uncontrolled pain    Complete by:  As directed    Call MD for:  temperature >100.4    Complete by:  As directed    Diet Carb Modified    Complete by:   As directed    Discharge instructions    Complete by:  As directed    Patient to be given Unasyn for one month followed by oral antibiotics (Augmentin) for 4 more weeks. CBC, basic metabolic panel in 1 week. CRP, ESR every week.  You were cared for by a hospitalist during your hospital stay. If you have any questions about your discharge medications or the care you received while you were in the hospital after you are discharged, you can call the unit and asked to speak with the hospitalist on call if the hospitalist that took care of you is not available. Once you are discharged, your primary care physician will handle any further medical issues. Please note that NO REFILLS for any discharge medications will be authorized once you are discharged, as it is imperative that you return to your primary care physician (or establish a relationship with a primary care physician if you do not have one) for your aftercare needs so that they can reassess your need for medications and monitor your lab values. If you do not have a primary care physician, you can call [phone] for a physician referral.   Increase activity slowly    Complete by:  As directed       ALLERGIES: No Known Allergies   Current Discharge Medication List    START taking these medications   Details  acetaminophen (TYLENOL) 325 MG tablet Take 2 tablets (650 mg total) by mouth every 6 (six) hours as needed for mild pain (or Fever >/= 101).    Ampicillin-Sulbactam 3 g in sodium chloride 0.9 % 100 mL Inject 3 g into the vein every 6 (six) hours. FOR 1 MONTH AND THEN ORAL ANTIBIOTICS.    bisacodyl (DULCOLAX) 10 MG suppository Place 1 suppository (10 mg total) rectally daily as needed for moderate constipation (now). Qty: 12 suppository, Refills: 0    oxyCODONE (ROXICODONE) 15 MG immediate release tablet Take 1 tablet (15 mg total) by mouth every 3 (three) hours as needed for moderate pain. Qty: 30 tablet, Refills: 0    polyethylene  glycol (MIRALAX / GLYCOLAX) packet Take 17 g by mouth 2 (two) times daily. Qty: 14 each, Refills: 0    senna (SENOKOT) 8.6 MG TABS tablet Take 1 tablet (8.6 mg total) by mouth 2 (two) times daily. Qty: 120 each, Refills: 0  vitamin B-12 1000 MCG tablet Take 1 tablet (1,000 mcg total) by mouth daily.      CONTINUE these medications which have CHANGED   Details  ALPRAZolam (XANAX) 0.5 MG tablet Take 1 tablet (0.5 mg total) by mouth 3 (three) times daily as needed for anxiety. Qty: 30 tablet, Refills: 0    Insulin Detemir (LEVEMIR FLEXPEN) 100 UNIT/ML Pen Inject 18 Units into the skin at bedtime. Qty: 15 mL, Refills: 11    mirtazapine (REMERON) 30 MG tablet Take 1 tablet (30 mg total) by mouth at bedtime. Qty: 30 tablet, Refills: 0    oxyCODONE-acetaminophen (PERCOCET) 10-325 MG tablet Take 1 tablet by mouth 4 (four) times daily. Qty: 30 tablet, Refills: 0    risperiDONE (RISPERDAL) 0.25 MG tablet Take 1 tablet (0.25 mg total) by mouth 2 (two) times daily. Qty: 60 tablet, Refills: 0    zolpidem (AMBIEN) 5 MG tablet Take 1 tablet (5 mg total) by mouth at bedtime as needed for sleep. Qty: 30 tablet, Refills: 0      CONTINUE these medications which have NOT CHANGED   Details  aspirin EC 81 MG tablet Take 1 tablet by mouth daily.    atorvastatin (LIPITOR) 80 MG tablet Take 80 mg by mouth daily.    hydrochlorothiazide (HYDRODIURIL) 25 MG tablet Take 25 mg by mouth daily.    metFORMIN (GLUCOPHAGE) 1000 MG tablet Take 1,000-1,500 mg by mouth See admin instructions. Take 1500 mg by mouth in the morning and take 1000 mg by mouth in the evening.    omeprazole (PRILOSEC) 40 MG capsule Take 40 mg by mouth daily.    pregabalin (LYRICA) 150 MG capsule Take 300 mg by mouth 2 (two) times daily.     sertraline (ZOLOFT) 100 MG tablet Take 100 mg by mouth daily.    metoprolol (LOPRESSOR) 50 MG tablet Take 50 mg by mouth 2 (two) times daily.      STOP taking these medications      insulin aspart (NOVOLOG) 100 UNIT/ML FlexPen      traMADol (ULTRAM) 50 MG tablet           Follow-up Information    Alexis Frock, MD Follow up in 4 week(s).   Specialty:  Urology Why:  Bladder tumor Contact information: [address] [place] [address] [phone]        Marygrace Drought, MD Follow up in 2 week(s).   Specialty:  Family Medicine Contact information: [place] [address] [phone]        Bobby Rumpf, MD Follow up in 4 week(s).   Specialty:  Infectious Diseases Why:  for discitis Contact information: [address] [place] [address] [phone]           TOTAL DISCHARGE TIME: 35 minutes  [facility] Pager [phone]  03/05/2016, 11:12 AM

## 2016-03-05 NOTE — Progress Notes (Signed)
SW reached out to [facility] to confirm that they have offered the pt a bed. SW spoke with administrator/ John who states that the pt is welcomed to come today.   Nurse Report Number: [phone]  Dennis Haynes, MSW [phone]

## 2016-03-05 NOTE — Discharge Instructions (Signed)
Diskitis Diskitis is irritation and swelling (inflammation) of the disks in the spine. Disks are soft structures that cushion the bones of the spine. This is not a common condition. Diskitis most often affects the disks of the lower back (lumbar disks) or upper back (thoracic disks). CAUSES  A bacterial or viral infection can lead to diskitis. When diskitis develops, it is often accompanied by infection-induced inflammation of the bones (osteomyelitis) surrounding the spine. The condition can also be caused by inflammation from another condition, like an autoimmune disease. If you have an autoimmune disease, your body's defense system (immune system) mistakenly attacks your own healthy cells instead of germs and other things that can make you sick. RISK FACTORS People at higher risk of developing diskitis include:  Children.  Older persons.  People with weak immune systems or immune system disorders.  People with diabetes.  People having chemotherapy. SIGNS AND SYMPTOMS  Back or stomach pain is the most common symptom of diskitis. Walking, standing, and sitting may be painful. Other symptoms may include:   Trouble standing or rising from a sitting position.  Fever lower than 102F (38.9C).  Back stiffness.  Flu-like symptoms, such as a sore throat and a runny nose.  Irritability.  Feeling pain when the affected area is touched. DIAGNOSIS  Your health care provider can diagnose diskitis based on symptoms and medical history. Your health care provider will also do a physical exam. You may also need to have blood tests or imaging studies, such as:  X-ray of the spine.  MRI of the spine.  A bone scan. TREATMENT  Treatment for diskitis may include:  Bed rest.  Medicines, such as:  Antibiotics to treat a possible bacterial infection.  Anti-inflammatory medicines.  Steroids if the condition does not improve over time.  Pain-relieving medicines.  A brace to stop your  back from moving. HOME CARE INSTRUCTIONS  If you were prescribed an antibiotic medicine, finish it all even if you start to feel better.  Take medicines only as directed by your health care provider.  Keep all follow-up visits as directed by your health care provider. This is important. SEEK MEDICAL CARE IF:   You have difficulty walking or standing.  You have persistent back pain.  You are having side effects from medicines.   This information is not intended to replace advice given to you by your health care provider. Make sure you discuss any questions you have with your health care provider.   Document Released: 04/27/2004 Document Revised: 06/17/2014 Document Reviewed: 09/29/2013 Elsevier Interactive Patient Education [employer].

## 2016-03-05 NOTE — Progress Notes (Signed)
PASRR: [id number]

## 2016-04-01 ENCOUNTER — Other Ambulatory Visit: Payer: Self-pay

## 2016-04-03 ENCOUNTER — Ambulatory Visit (INDEPENDENT_AMBULATORY_CARE_PROVIDER_SITE_OTHER): Payer: Medicare Other | Admitting: Infectious Diseases

## 2016-04-03 DIAGNOSIS — B372 Candidiasis of skin and nail: Secondary | ICD-10-CM | POA: Diagnosis not present

## 2016-04-03 DIAGNOSIS — M4644 Discitis, unspecified, thoracic region: Secondary | ICD-10-CM | POA: Diagnosis not present

## 2016-04-03 NOTE — Progress Notes (Signed)
   Subjective:    Patient ID: Dennis Haynes, male    DOB: 1940/10/13, 75 y.o.   MRN: [id number]  HPI 75 yo M with hx of DM2, adm to [facility] 8-28 to 8-31 for UTI (80k pan-sens E facaelis) then d/c to rehab. He returned on 9-20 to [facility] with 10 days of worsening low back pain. 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 He had IR aspirate on day of adm.  He was started on vanco/zosyn.  His UCx has again shown E faecalis. His BCx are negative.  His aspirate Cx is E faecalis.  He was d/c home on 9-26.  His anbx end date in 10-23. He was then schedule to take augmentin for 1 month.  Today complains of back pain with his ambulance ride here.  Overall he feels like his back pain is better.    Review of Systems  Constitutional: Negative for chills and fever.  Gastrointestinal: Negative for constipation.  Skin: Negative for rash.       Objective:   Physical Exam  Constitutional: He appears well-developed and well-nourished.  HENT:  Mouth/Throat: No oropharyngeal exudate.  Eyes: EOM are normal. Pupils are equal, round, and reactive to light.  Neck: Neck supple.  Cardiovascular: Normal rate, regular rhythm and normal heart sounds.   Pulmonary/Chest: Effort normal and breath sounds normal.  Abdominal: Soft. Bowel sounds are normal. There is no tenderness. There is no rebound.  Musculoskeletal:       Arms: Lymphadenopathy:    He has no cervical adenopathy.  Skin:     Psychiatric:  Knows the date, not clear of location. Know president.           Assessment & Plan:

## 2016-04-03 NOTE — Progress Notes (Signed)
     9.26 d/c End date 10-23

## 2016-04-03 NOTE — Assessment & Plan Note (Signed)
Will give him 1 week of fluconazole.

## 2016-04-03 NOTE — Assessment & Plan Note (Addendum)
Not clear to me if his pain is from T11 fracture or from infection.  Will pull his pic. 39 cm removed.  Will change him to augmentin for 1 month.  If his pain is not improved, would consider repeat MRI.  Will recheck his BCx He can f/u with pcp.

## 2016-04-04 NOTE — Addendum Note (Signed)
Addended by: Reggy Eye on: 04/04/2016 04:27 PM   Modules accepted: Orders

## 2016-04-10 ENCOUNTER — Inpatient Hospital Stay: Payer: Medicare Other

## 2016-04-10 ENCOUNTER — Inpatient Hospital Stay
Admission: EM | Admit: 2016-04-10 | Discharge: 2016-04-14 | DRG: 871 | Disposition: A | Discharge status: Hospice | Payer: Medicare Other | Attending: Internal Medicine | Admitting: Internal Medicine

## 2016-04-10 DIAGNOSIS — M549 Dorsalgia, unspecified: Secondary | ICD-10-CM | POA: Diagnosis not present

## 2016-04-10 DIAGNOSIS — F419 Anxiety disorder, unspecified: Secondary | ICD-10-CM | POA: Diagnosis present

## 2016-04-10 DIAGNOSIS — E872 Acidosis: Secondary | ICD-10-CM | POA: Diagnosis present

## 2016-04-10 DIAGNOSIS — Z7401 Bed confinement status: Secondary | ICD-10-CM

## 2016-04-10 DIAGNOSIS — R05 Cough: Secondary | ICD-10-CM | POA: Diagnosis present

## 2016-04-10 DIAGNOSIS — L89511 Pressure ulcer of right ankle, stage 1: Secondary | ICD-10-CM | POA: Diagnosis present

## 2016-04-10 DIAGNOSIS — Z23 Encounter for immunization: Secondary | ICD-10-CM | POA: Diagnosis not present

## 2016-04-10 DIAGNOSIS — A419 Sepsis, unspecified organism: Secondary | ICD-10-CM | POA: Diagnosis present

## 2016-04-10 DIAGNOSIS — Z66 Do not resuscitate: Secondary | ICD-10-CM | POA: Diagnosis present

## 2016-04-10 DIAGNOSIS — G9341 Metabolic encephalopathy: Secondary | ICD-10-CM | POA: Diagnosis present

## 2016-04-10 DIAGNOSIS — F0390 Unspecified dementia without behavioral disturbance: Secondary | ICD-10-CM | POA: Diagnosis present

## 2016-04-10 DIAGNOSIS — J189 Pneumonia, unspecified organism: Secondary | ICD-10-CM | POA: Diagnosis present

## 2016-04-10 DIAGNOSIS — N329 Bladder disorder, unspecified: Secondary | ICD-10-CM | POA: Diagnosis present

## 2016-04-10 DIAGNOSIS — L89521 Pressure ulcer of left ankle, stage 1: Secondary | ICD-10-CM | POA: Diagnosis present

## 2016-04-10 DIAGNOSIS — E119 Type 2 diabetes mellitus without complications: Secondary | ICD-10-CM | POA: Diagnosis present

## 2016-04-10 DIAGNOSIS — F17210 Nicotine dependence, cigarettes, uncomplicated: Secondary | ICD-10-CM | POA: Diagnosis present

## 2016-04-10 DIAGNOSIS — Z794 Long term (current) use of insulin: Secondary | ICD-10-CM | POA: Diagnosis not present

## 2016-04-10 DIAGNOSIS — N179 Acute kidney failure, unspecified: Secondary | ICD-10-CM | POA: Diagnosis not present

## 2016-04-10 DIAGNOSIS — I10 Essential (primary) hypertension: Secondary | ICD-10-CM | POA: Diagnosis present

## 2016-04-10 DIAGNOSIS — L8990 Pressure ulcer of unspecified site, unspecified stage: Secondary | ICD-10-CM | POA: Insufficient documentation

## 2016-04-10 DIAGNOSIS — N1339 Other hydronephrosis: Secondary | ICD-10-CM

## 2016-04-10 DIAGNOSIS — Y95 Nosocomial condition: Secondary | ICD-10-CM | POA: Diagnosis present

## 2016-04-10 DIAGNOSIS — N1330 Unspecified hydronephrosis: Secondary | ICD-10-CM

## 2016-04-10 DIAGNOSIS — Z515 Encounter for palliative care: Secondary | ICD-10-CM | POA: Diagnosis present

## 2016-04-10 DIAGNOSIS — K219 Gastro-esophageal reflux disease without esophagitis: Secondary | ICD-10-CM | POA: Diagnosis present

## 2016-04-10 DIAGNOSIS — R627 Adult failure to thrive: Secondary | ICD-10-CM | POA: Diagnosis present

## 2016-04-10 DIAGNOSIS — Z7982 Long term (current) use of aspirin: Secondary | ICD-10-CM | POA: Diagnosis not present

## 2016-04-10 DIAGNOSIS — Z452 Encounter for adjustment and management of vascular access device: Secondary | ICD-10-CM

## 2016-04-10 DIAGNOSIS — N3289 Other specified disorders of bladder: Secondary | ICD-10-CM | POA: Diagnosis present

## 2016-04-10 DIAGNOSIS — N170 Acute kidney failure with tubular necrosis: Secondary | ICD-10-CM | POA: Diagnosis present

## 2016-04-10 DIAGNOSIS — Z79899 Other long term (current) drug therapy: Secondary | ICD-10-CM

## 2016-04-10 DIAGNOSIS — R296 Repeated falls: Secondary | ICD-10-CM | POA: Diagnosis present

## 2016-04-10 DIAGNOSIS — E860 Dehydration: Secondary | ICD-10-CM | POA: Diagnosis present

## 2016-04-10 DIAGNOSIS — E118 Type 2 diabetes mellitus with unspecified complications: Secondary | ICD-10-CM

## 2016-04-10 DIAGNOSIS — R6521 Severe sepsis with septic shock: Secondary | ICD-10-CM | POA: Diagnosis present

## 2016-04-10 DIAGNOSIS — R571 Hypovolemic shock: Secondary | ICD-10-CM | POA: Diagnosis not present

## 2016-04-10 DIAGNOSIS — R570 Cardiogenic shock: Secondary | ICD-10-CM | POA: Diagnosis not present

## 2016-04-10 DIAGNOSIS — M4644 Discitis, unspecified, thoracic region: Secondary | ICD-10-CM | POA: Diagnosis not present

## 2016-04-10 DIAGNOSIS — E785 Hyperlipidemia, unspecified: Secondary | ICD-10-CM | POA: Diagnosis present

## 2016-04-10 DIAGNOSIS — R059 Cough, unspecified: Secondary | ICD-10-CM

## 2016-04-10 DIAGNOSIS — L89159 Pressure ulcer of sacral region, unspecified stage: Secondary | ICD-10-CM | POA: Diagnosis present

## 2016-04-10 LAB — COMPREHENSIVE METABOLIC PANEL
ALBUMIN: 2.2 g/dL — AB (ref 3.5–5.0)
ALT: 17 U/L (ref 17–63)
AST: 51 U/L — AB (ref 15–41)
Alkaline Phosphatase: 121 U/L (ref 38–126)
Anion gap: 17 — ABNORMAL HIGH (ref 5–15)
BUN: 61 mg/dL — AB (ref 6–20)
CHLORIDE: 99 mmol/L — AB (ref 101–111)
CO2: 23 mmol/L (ref 22–32)
Calcium: 8.6 mg/dL — ABNORMAL LOW (ref 8.9–10.3)
Creatinine, Ser: 3.84 mg/dL — ABNORMAL HIGH (ref 0.61–1.24)
GFR calc Af Amer: 16 mL/min — ABNORMAL LOW (ref >60)
GFR calc non Af Amer: 14 mL/min — ABNORMAL LOW (ref >60)
GLUCOSE: 47 mg/dL — AB (ref 65–99)
POTASSIUM: 4.2 mmol/L (ref 3.5–5.1)
Sodium: 139 mmol/L (ref 135–145)
Total Bilirubin: <0.1 mg/dL — ABNORMAL LOW (ref 0.3–1.2)
Total Protein: 6.4 g/dL — ABNORMAL LOW (ref 6.5–8.1)

## 2016-04-10 LAB — URINALYSIS COMPLETE WITH MICROSCOPIC (ARMC ONLY)
BILIRUBIN URINE: NEGATIVE
GLUCOSE, UA: 50 mg/dL — AB
KETONES UR: NEGATIVE mg/dL
LEUKOCYTES UA: NEGATIVE
NITRITE: NEGATIVE
Protein, ur: 100 mg/dL — AB
SPECIFIC GRAVITY, URINE: 1.018 (ref 1.005–1.030)
Squamous Epithelial / LPF: NONE SEEN
pH: 5 (ref 5.0–8.0)

## 2016-04-10 LAB — CBC WITH DIFFERENTIAL/PLATELET
Basophils Absolute: 0.1 10*3/uL (ref 0–0.1)
Basophils Relative: 1 %
EOS PCT: 4 %
Eosinophils Absolute: 0.3 10*3/uL (ref 0–0.7)
HCT: 35.6 % — ABNORMAL LOW (ref 40.0–52.0)
Hemoglobin: 11.6 g/dL — ABNORMAL LOW (ref 13.0–18.0)
LYMPHS ABS: 1.6 10*3/uL (ref 1.0–3.6)
LYMPHS PCT: 19 %
MCH: 25.2 pg — AB (ref 26.0–34.0)
MCHC: 32.6 g/dL (ref 32.0–36.0)
MCV: 77.3 fL — AB (ref 80.0–100.0)
MONO ABS: 0.6 10*3/uL (ref 0.2–1.0)
Monocytes Relative: 7 %
Neutro Abs: 5.9 10*3/uL (ref 1.4–6.5)
Neutrophils Relative %: 69 %
PLATELETS: 267 10*3/uL (ref 150–440)
RBC: 4.6 MIL/uL (ref 4.40–5.90)
RDW: 22.1 % — ABNORMAL HIGH (ref 11.5–14.5)
WBC: 8.5 10*3/uL (ref 3.8–10.6)

## 2016-04-10 MED ORDER — HYDROMORPHONE HCL 1 MG/ML IJ SOLN
1.0000 mg | Freq: Once | INTRAMUSCULAR | Status: AC
  Administered 2016-04-10: 1 mg via INTRAVENOUS
  Filled 2016-04-10: qty 1

## 2016-04-10 MED ORDER — SODIUM CHLORIDE 0.9 % IV BOLUS (SEPSIS)
1000.0000 mL | Freq: Once | INTRAVENOUS | Status: AC
  Administered 2016-04-10: 1000 mL via INTRAVENOUS

## 2016-04-10 MED ORDER — NOREPINEPHRINE BITARTRATE 1 MG/ML IV SOLN
0.0000 ug/min | INTRAVENOUS | Status: DC
  Administered 2016-04-10: 2 ug/min via INTRAVENOUS

## 2016-04-10 MED ORDER — ONDANSETRON HCL 4 MG/2ML IJ SOLN
4.0000 mg | Freq: Once | INTRAMUSCULAR | Status: AC
  Administered 2016-04-10: 4 mg via INTRAVENOUS
  Filled 2016-04-10: qty 2

## 2016-04-10 MED ORDER — SODIUM CHLORIDE 0.9 % IV SOLN
INTRAVENOUS | Status: AC
  Administered 2016-04-10: 23:00:00 via INTRAVENOUS

## 2016-04-10 MED ORDER — NOREPINEPHRINE 4 MG/250ML-% IV SOLN
INTRAVENOUS | Status: AC
  Administered 2016-04-11: 2 mg
  Filled 2016-04-10: qty 250

## 2016-04-10 NOTE — ED Triage Notes (Signed)
Per EMS, pt's BP has been low in the [id number] systolic at home. EMS gave pt 257ml of NS which brought the pt's BP up to 92/52 en route. Pt was also put on 4L of oxygen due to not being able to get a good O2 saturation. Pt reports being weak for the past 3 months and not able to walk as much as he use to. Pt also states back pain.

## 2016-04-10 NOTE — H&P (Addendum)
[place] [facility] [place] NAME: Dennis Haynes    MR#:  [id number]  DATE OF BIRTH:  04/02/1941  DATE OF ADMISSION:  04/10/2016  PRIMARY CARE PHYSICIAN: Marygrace Drought, MD   REQUESTING/REFERRING PHYSICIAN: Marcelene Butte, MD  CHIEF COMPLAINT:   Chief Complaint  Patient presents with  . Hypotension  . Weakness    HISTORY OF PRESENT ILLNESS:  Dennis Haynes  is a 75 y.o. male who presents with Confusion, lethargy, low blood pressure. Patient is drowsy and unable to contribute much to his own history. History is taken from his son who is at bedside with the patient. He states that the patient was recently discharged from a rehabilitation facility back home, but has not been very active since that time. He was recently being treated for discitis, and is still on oral antibiotics for the same. Today he was very lethargic and his son checked his blood pressure and found to be very low. He called home health nurse who came to check it and also found that it was low. At that point he was brought to the ED for evaluation. Here he was also found to be hypotensive, but had initially a good response to some IV fluids. His creatinine is significantly elevated from his baseline. Hospitalists were called for admission.  PAST MEDICAL HISTORY:   Past Medical History:  Diagnosis Date  . Anxiety   . Chronic back pain   . Dementia   . Diabetes ([place])   . Hypertension     PAST SURGICAL HISTORY:   Past Surgical History:  Procedure Laterality Date  . APPENDECTOMY    . [place] ASPIRATION  02/27/2016   THORACIC  . IR GENERIC HISTORICAL  02/28/2016   IR FLUORO GUIDED NEEDLE PLC ASPIRATION/INJECTION LOC 02/28/2016 Arne Cleveland, MD [facility]-INTERV RAD    SOCIAL HISTORY:   Social History  Substance Use Topics  . Smoking status: Current Every Day Smoker    Packs/day: 0.50    Types: Cigarettes  . Smokeless tobacco: Never Used  . Alcohol use No    FAMILY HISTORY:   Family  History  Problem Relation Age of Onset  . Hypertension Other     DRUG ALLERGIES:  No Known Allergies  MEDICATIONS AT HOME:   Prior to Admission medications   Medication Sig Start Date End Date Taking? Authorizing Provider  acetaminophen (TYLENOL) 325 MG tablet Take 2 tablets (650 mg total) by mouth every 6 (six) hours as needed for mild pain (or Fever >/= 101). 03/05/16   Bonnielee Haff, MD  ALPRAZolam Duanne Moron) 0.5 MG tablet Take 1 tablet (0.5 mg total) by mouth 3 (three) times daily as needed for anxiety. 03/05/16   Bonnielee Haff, MD  aspirin EC 81 MG tablet Take 1 tablet by mouth daily.    Historical Provider, MD  atorvastatin (LIPITOR) 80 MG tablet Take 80 mg by mouth daily.    Historical Provider, MD  bisacodyl (DULCOLAX) 10 MG suppository Place 1 suppository (10 mg total) rectally daily as needed for moderate constipation (now). 03/05/16   Bonnielee Haff, MD  hydrochlorothiazide (HYDRODIURIL) 25 MG tablet Take 25 mg by mouth daily.    Historical Provider, MD  Insulin Detemir (LEVEMIR FLEXPEN) 100 UNIT/ML Pen Inject 18 Units into the skin at bedtime. 03/05/16   Bonnielee Haff, MD  metFORMIN (GLUCOPHAGE) 500 MG tablet Take by mouth 2 (two) times daily. 3 tablets every morning - 2 tablets every evening. 04/04/16   Historical Provider, MD  metoprolol (  LOPRESSOR) 50 MG tablet Take 50 mg by mouth 2 (two) times daily.    Historical Provider, MD  mirtazapine (REMERON) 30 MG tablet Take 1 tablet (30 mg total) by mouth at bedtime. 03/05/16   Bonnielee Haff, MD  NOVOLOG FLEXPEN 100 UNIT/ML FlexPen  02/08/16   Historical Provider, MD  omeprazole (PRILOSEC) 40 MG capsule Take 40 mg by mouth daily.    Historical Provider, MD  Oxycodone HCl 10 MG TABS Take 1 tablet by mouth every 6 (six) hours as needed. 04/04/16   Historical Provider, MD  polyethylene glycol (MIRALAX / GLYCOLAX) packet Take 17 g by mouth 2 (two) times daily. 03/05/16   Bonnielee Haff, MD  polyethylene glycol powder (GLYCOLAX/MIRALAX)  powder Take 17 g by mouth 2 (two) times daily as needed. 04/04/16   Historical Provider, MD  pregabalin (LYRICA) 150 MG capsule Take 300 mg by mouth 2 (two) times daily.     Historical Provider, MD  RA VITAMIN B-12 TR 1000 MCG TBCR Take 1 tablet by mouth daily. 04/04/16   Historical Provider, MD  risperiDONE (RISPERDAL) 0.25 MG tablet Take 1 tablet (0.25 mg total) by mouth 2 (two) times daily. 03/05/16   Bonnielee Haff, MD  senna (SENOKOT) 8.6 MG TABS tablet Take 1 tablet (8.6 mg total) by mouth 2 (two) times daily. 03/05/16   Bonnielee Haff, MD  sertraline (ZOLOFT) 100 MG tablet Take 100 mg by mouth daily.    Historical Provider, MD  vitamin B-12 1000 MCG tablet Take 1 tablet (1,000 mcg total) by mouth daily. 03/05/16   Bonnielee Haff, MD  zolpidem (AMBIEN) 5 MG tablet Take 1 tablet (5 mg total) by mouth at bedtime as needed for sleep. 03/05/16   Bonnielee Haff, MD    REVIEW OF SYSTEMS:  Review of Systems  Constitutional: Negative for chills, fever, malaise/fatigue and weight loss.  HENT: Negative for ear pain, hearing loss and tinnitus.   Eyes: Negative for blurred vision, double vision, pain and redness.  Respiratory: Negative for cough, hemoptysis and shortness of breath.   Cardiovascular: Negative for chest pain, palpitations, orthopnea and leg swelling.  Gastrointestinal: Negative for abdominal pain, constipation, diarrhea, nausea and vomiting.  Genitourinary: Negative for dysuria, frequency and hematuria.  Musculoskeletal: Negative for back pain, joint pain and neck pain.  Skin:       No acne, rash, or lesions  Neurological: Positive for weakness. Negative for dizziness, tremors and focal weakness.       Lethargy  Endo/Heme/Allergies: Negative for polydipsia. Does not bruise/bleed easily.  Psychiatric/Behavioral: Negative for depression. The patient is not nervous/anxious and does not have insomnia.      VITAL SIGNS:   Vitals:   04/10/16 1921 04/10/16 1930 04/10/16 1945 04/10/16  2000  BP: 101/76 91/75 (!) 92/46 (!) 92/48  Pulse: 77 79 80 82  Resp: (!) 21 20 13 14   Temp:      TempSrc:      SpO2: 96% 94% 96% 97%  Weight:      Height:       Wt Readings from Last 3 Encounters:  04/10/16 86.2 kg (190 lb)  03/02/16 89.7 kg (197 lb 11.2 oz)  02/27/16 83.9 kg (185 lb)    PHYSICAL EXAMINATION:  Physical Exam  Vitals reviewed. Constitutional: He appears well-developed and well-nourished. No distress.  HENT:  Head: Normocephalic and atraumatic.  Mouth/Throat: Oropharynx is clear and moist.  Eyes: Conjunctivae and EOM are normal. Pupils are equal, round, and reactive to light. No scleral icterus.  Neck:  Normal range of motion. Neck supple. No JVD present. No thyromegaly present.  Cardiovascular: Normal rate, regular rhythm and intact distal pulses.  Exam reveals no gallop and no friction rub.   No murmur heard. Respiratory: Effort normal and breath sounds normal. No respiratory distress. He has no wheezes. He has no rales.  GI: Soft. Bowel sounds are normal. He exhibits no distension. There is no tenderness.  Musculoskeletal: Normal range of motion. He exhibits no edema.  No arthritis, no gout  Lymphadenopathy:    He has no cervical adenopathy.  Neurological: No cranial nerve deficit.  No dysarthria, no aphasia.  Unable to fully assess due to the patient's condition  Skin: Skin is warm and dry. No rash noted. No erythema.  Psychiatric:  Unable to assess due to patient condition    LABORATORY PANEL:   CBC  Recent Labs Lab 04/10/16 1855  WBC 8.5  HGB 11.6*  HCT 35.6*  PLT 267   ------------------------------------------------------------------------------------------------------------------  Chemistries   Recent Labs Lab 04/10/16 1855  NA 139  K 4.2  CL 99*  CO2 23  GLUCOSE 47*  BUN 61*  CREATININE 3.84*  CALCIUM 8.6*  AST 51*  ALT 17  ALKPHOS 121  BILITOT <0.1*    ------------------------------------------------------------------------------------------------------------------  Cardiac Enzymes No results for input(s): TROPONINI in the last 168 hours. ------------------------------------------------------------------------------------------------------------------  RADIOLOGY:  No results found.  EKG:   Orders placed or performed during the hospital encounter of 04/10/16  . EKG 12-Lead  . EKG 12-Lead    IMPRESSION AND PLAN:  Principal Problem:   AKI (acute kidney injury) ([place]) - unclear etiology at this time, though very likely due to poor by mouth intake and hypertension. Strong suspicion for prerenal insult here. We will hydrate the patient with IV fluids, when nephrotoxins, monitor for expected improvement Active Problems:   Essential hypertension - blood pressure is currently low, hold antihypertensives, IV fluids for blood pressure support as above.  Patient is also hypothermic, though no overt signs of infection.  Adding UA and CXR.   Type 2 diabetes mellitus with complication, with long-term current use of insulin (HCC) - sliding scale insulin with corresponding glucose checks and carb modified diet   Anxiety - home meds   Discitis of thoracic region - patient is still on by mouth Augmentin, continue this while here twice a day   GERD (gastroesophageal reflux disease) - home dose PPI   Bladder mass - incidental finding on CT scans, patient will likely need urologic follow-up in outpatient setting if he is going to pursue diagnosis and treatment.  All the records are reviewed and case discussed with ED provider. Management plans discussed with the patient and/or family.  DVT PROPHYLAXIS: SubQ heparin  GI PROPHYLAXIS: PPI  ADMISSION STATUS: Inpatient  CODE STATUS: Full Code Status History    Date Active Date Inactive Code Status Order ID Comments User Context   02/28/2016  1:02 AM 03/05/2016  8:01 PM Full Code [id number]  Rise Patience, MD Inpatient   02/05/2016 11:32 PM 02/08/2016  4:15 PM Full Code [id number]  Nicholes Mango, MD Inpatient    Advance Directive Documentation   Flowsheet Row Most Recent Value  Type of Advance Directive  Healthcare Power of [place], Living will  Pre-existing out of facility DNR order (yellow form or pink MOST form)  No data  "MOST" Form in Place?  No data      TOTAL TIME TAKING CARE OF THIS PATIENT: 45 minutes.    [place], Marianna  04/10/2016, 8:41 PM  [facility]  Office  [phone]  CC: Primary care physician; Marygrace Drought, MD

## 2016-04-10 NOTE — ED Provider Notes (Signed)
Time Seen: Approximately 1856  I have reviewed the triage notes  Chief Complaint: Hypotension and Weakness   History of Present Illness: Dennis Haynes is a 75 y.o. male who presents with low blood pressure at home. Per EMS patient's blood pressure was in the [id number] systolic. He responded well to just a 200 mL fluid bolus prior to arrival. The patient has a known history of discitis and a history history of diabetes. Patient apparently was admitted and given a IV antibiotics and was sent home on oral antibiotic therapy. He has followed up with infectious disease. Patient denies any new complaints. He states that he is having significant back pain and apparently did not receive her pain medication because of his hypotension. Patient's somewhat poor historian but according to EMS no new fever, nausea, vomiting.   Past Medical History:  Diagnosis Date  . Anxiety   . Chronic back pain   . Dementia   . Diabetes ([place])   . Hypertension     Patient Active Problem List   Diagnosis Date Noted  . Candidal dermatitis 04/03/2016  . Type 2 diabetes mellitus with complication, with long-term current use of insulin ([place])   . Back pain 02/28/2016  . T11 vertebral fracture ([place]) 02/28/2016  . Discitis of thoracic region 02/28/2016  . Essential hypertension 02/28/2016  . Discitis 02/28/2016  . Diabetes mellitus type 2, controlled ([place])   . Pressure ulcer 02/07/2016  . AKI (acute kidney injury) ([place]) 02/05/2016    Past Surgical History:  Procedure Laterality Date  . APPENDECTOMY    . [place] ASPIRATION  02/27/2016   THORACIC  . IR GENERIC HISTORICAL  02/28/2016   IR FLUORO GUIDED NEEDLE PLC ASPIRATION/INJECTION LOC 02/28/2016 Arne Cleveland, MD [facility]-INTER[facility] RAD    Past Surgical History:  Procedure Laterality Date  . APPENDECTOMY    . [place] ASPIRATION  02/27/2016   THORACIC  . IR GENERIC HISTORICAL  02/28/2016   IR FLUORO GUIDED NEEDLE PLC ASPIRATION/INJECTION LOC 02/28/2016 Arne Cleveland, MD [facility]-INTERV  RAD    Current Outpatient Rx  . Order #: [id number] Class: No Print  . Order #: [id number] Class: Print  . Order #: [id number] Class: Historical Med  . Order #: [id number] Class: Historical Med  . Order #: [id number] Class: No Print  . Order #: [id number] Class: Historical Med  . Order #: [id number] Class: No Print  . Order #: [id number] Class: Historical Med  . Order #: [id number] Class: Historical Med  . Order #: [id number] Class: Print  . Order #: [id number] Class: Historical Med  . Order #: [id number] Class: Print  . Order #: [id number] Class: Print  . Order #: [id number] Class: No Print  . Order #: [id number] Class: Historical Med  . Order #: [id number] Class: Print  . Order #: [id number] Class: No Print  . Order #: [id number] Class: Historical Med  . Order #: [id number] Class: No Print  . Order #: [id number] Class: Print    Allergies:  Review of patient's allergies indicates no known allergies.  Family History: Family History  Problem Relation Age of Onset  . Hypertension Other     Social History: Social History  Substance Use Topics  . Smoking status: Current Every Day Smoker    Packs/day: 0.50    Types: Cigarettes  . Smokeless tobacco: Never Used  . Alcohol use No     Review of Systems:   10 point review of systems was performed and was otherwise negative:  Constitutional: No fever Eyes: No visual disturbances ENT: No sore throat, ear pain Cardiac: No chest pain Respiratory: No shortness of breath, wheezing, or  stridor Abdomen: No abdominal pain, no vomiting, No diarrhea Endocrine: No weight loss, No night sweats Extremities: No peripheral edema, cyanosis Skin: No rashes, easy bruising Neurologic: No focal weakness, trouble with speech or swollowing Urologic: No dysuria, Hematuria, or urinary frequency Chronic low back pain with no new weakness in the lower extremities.  Physical Exam:  ED Triage Vitals  Enc Vitals Group     BP 04/10/16 1844 (!) 90/50     Pulse Rate 04/10/16 1844  69     Resp 04/10/16 1848 15     Temp 04/10/16 1844 97.4 F (36.3 C)     Temp Source 04/10/16 1844 Oral     SpO2 04/10/16 1844 98 %     Weight 04/10/16 1845 190 lb (86.2 kg)     Height 04/10/16 1845 5\' 10"  (1.778 m)     Head Circumference --      Peak Flow --      Pain Score 04/10/16 1845 9     Pain Loc --      Pain Edu? --      Excl. in [place]? --     General: Awake , Alert , and Oriented times 2 GCS 15 Head: Normal cephalic , atraumatic Eyes: Pupils equal , round, reactive to light Nose/Throat: No nasal drainage, patent upper airway without erythema or exudate. Dry mucous membranes Neck: Supple, Full range of motion, No anterior adenopathy or palpable thyroid masses Lungs: Clear to ascultation without wheezes , rhonchi, or rales Heart: Regular rate, regular rhythm without murmurs , gallops , or rubs Abdomen: Soft, non tender without rebound, guarding , or rigidity; bowel sounds positive and symmetric in all 4 quadrants. No organomegaly .        Extremities: 2 plus symmetric pulses. No edema, clubbing or cyanosis Neurologic: normal ambulation, Motor symmetric without deficits, sensory intact Skin: warm, dry, no rashes   Labs:   All laboratory work was reviewed including any pertinent negatives or positives listed below:  Labs Reviewed  COMPREHENSIVE METABOLIC PANEL - Abnormal; Notable for the following:       Result Value   Chloride 99 (*)    Glucose, Bld 47 (*)    BUN 61 (*)    Creatinine, Ser 3.84 (*)    Calcium 8.6 (*)    Total Protein 6.4 (*)    Albumin 2.2 (*)    AST 51 (*)    Total Bilirubin <0.1 (*)    GFR calc non Af Amer 14 (*)    GFR calc Af Amer 16 (*)    Anion gap 17 (*)    All other components within normal limits  CBC WITH DIFFERENTIAL/PLATELET - Abnormal; Notable for the following:    Hemoglobin 11.6 (*)    HCT 35.6 (*)    MCV 77.3 (*)    MCH 25.2 (*)    RDW 22.1 (*)    All other components within normal limits    EKG: ED ECG REPORT I, Daymon Larsen, the attending physician, personally viewed and interpreted this ECG.  Date: 04/10/2016 EKG Time: 1840 Rate: 70 Rhythm: normal sinus rhythm with occasional PVCs QRS Axis: normal Intervals: normal ST/T Wave abnormalities: normal Conduction Disturbances: none Narrative Interpretation: unremarkable Low-voltage QRS with no acute ischemic changes noted    ED Course: * Patient was started on simple IV fluid bolus for his renal insufficiency. This most likely is prerenal based on his previous records etc. Patient is on hydrochlorothiazide. Patient is afebrile and since he's  been on antibiotics etc. I felt this was unlikely to be sepsis. He was continued with IV pain medication and his blood pressures responded slightly just with continued fluids. Patient will require admission due to his renal function. Clinical Course     Assessment:  Acute renal failure Dehydration with prerenal  Final Clinical Impression:   Final diagnoses:  Acute renal failure, unspecified acute renal failure type [facility])     Plan:  Inpatient management           Daymon Larsen, MD 04/10/16 [phone]

## 2016-04-10 NOTE — ED Notes (Signed)
This nurse gave Dennis Haynes the CCU RN report, Dr and nurse agree on MAP parameters for pt to be between 60-65. Fluids were started per order.

## 2016-04-10 NOTE — ED Notes (Signed)
Dr. Jannifer Franklin, hospitalist, at bedside.

## 2016-04-11 ENCOUNTER — Inpatient Hospital Stay: Payer: Medicare Other

## 2016-04-11 DIAGNOSIS — L8990 Pressure ulcer of unspecified site, unspecified stage: Secondary | ICD-10-CM | POA: Insufficient documentation

## 2016-04-11 DIAGNOSIS — R571 Hypovolemic shock: Secondary | ICD-10-CM

## 2016-04-11 DIAGNOSIS — M549 Dorsalgia, unspecified: Secondary | ICD-10-CM

## 2016-04-11 DIAGNOSIS — N179 Acute kidney failure, unspecified: Secondary | ICD-10-CM

## 2016-04-11 DIAGNOSIS — Z515 Encounter for palliative care: Secondary | ICD-10-CM

## 2016-04-11 LAB — BASIC METABOLIC PANEL
ANION GAP: 17 — AB (ref 5–15)
BUN: 61 mg/dL — ABNORMAL HIGH (ref 6–20)
CALCIUM: 8 mg/dL — AB (ref 8.9–10.3)
CO2: 17 mmol/L — ABNORMAL LOW (ref 22–32)
Chloride: 106 mmol/L (ref 101–111)
Creatinine, Ser: 3.95 mg/dL — ABNORMAL HIGH (ref 0.61–1.24)
GFR calc Af Amer: 16 mL/min — ABNORMAL LOW (ref >60)
GFR, EST NON AFRICAN AMERICAN: 13 mL/min — AB (ref >60)
GLUCOSE: 107 mg/dL — AB (ref 65–99)
Potassium: 4.5 mmol/L (ref 3.5–5.1)
SODIUM: 140 mmol/L (ref 135–145)

## 2016-04-11 LAB — CBC
HCT: 35.8 % — ABNORMAL LOW (ref 40.0–52.0)
HEMOGLOBIN: 11.6 g/dL — AB (ref 13.0–18.0)
MCH: 25.2 pg — ABNORMAL LOW (ref 26.0–34.0)
MCHC: 32.4 g/dL (ref 32.0–36.0)
MCV: 77.8 fL — ABNORMAL LOW (ref 80.0–100.0)
Platelets: 260 10*3/uL (ref 150–440)
RBC: 4.6 MIL/uL (ref 4.40–5.90)
RDW: 21.8 % — AB (ref 11.5–14.5)
WBC: 8.6 10*3/uL (ref 3.8–10.6)

## 2016-04-11 LAB — GLUCOSE, CAPILLARY
GLUCOSE-CAPILLARY: 117 mg/dL — AB (ref 65–99)
GLUCOSE-CAPILLARY: 53 mg/dL — AB (ref 65–99)
GLUCOSE-CAPILLARY: 97 mg/dL (ref 65–99)
Glucose-Capillary: 96 mg/dL (ref 65–99)
Glucose-Capillary: 167 mg/dL — ABNORMAL HIGH (ref 65–99)
Glucose-Capillary: 176 mg/dL — ABNORMAL HIGH (ref 65–99)

## 2016-04-11 LAB — MRSA PCR SCREENING: MRSA BY PCR: POSITIVE — AB

## 2016-04-11 LAB — MAGNESIUM: Magnesium: 1.5 mg/dL — ABNORMAL LOW (ref 1.7–2.4)

## 2016-04-11 LAB — LACTIC ACID, PLASMA
Lactic Acid, Venous: 2.9 mmol/L (ref 0.5–1.9)
Lactic Acid, Venous: 2.6 mmol/L (ref 0.5–1.9)

## 2016-04-11 MED ORDER — PNEUMOCOCCAL VAC POLYVALENT 25 MCG/0.5ML IJ INJ
0.5000 mL | INJECTION | INTRAMUSCULAR | Status: AC
  Administered 2016-04-14: 0.5 mL via INTRAMUSCULAR
  Filled 2016-04-11: qty 0.5

## 2016-04-11 MED ORDER — SODIUM CHLORIDE 0.9% FLUSH: 10.0000 mL | INTRAVENOUS | Status: DC | PRN

## 2016-04-11 MED ORDER — CHLORHEXIDINE GLUCONATE CLOTH 2 % EX PADS
6.0000 | MEDICATED_PAD | Freq: Every day | CUTANEOUS | Status: DC
  Administered 2016-04-12 – 2016-04-14 (×3): 6 via TOPICAL

## 2016-04-11 MED ORDER — INSULIN ASPART 100 UNIT/ML ~~LOC~~ SOLN
0.0000 [IU] | Freq: Every day | SUBCUTANEOUS | Status: DC
Start: 2016-04-11 — End: 2016-04-12

## 2016-04-11 MED ORDER — HALOPERIDOL LACTATE 5 MG/ML IJ SOLN: 2.0000 mg | Freq: Four times a day (QID) | INTRAMUSCULAR | Status: DC | PRN

## 2016-04-11 MED ORDER — DEXTROSE 50 % IV SOLN
25.0000 mL | INTRAVENOUS | Status: AC
  Administered 2016-04-11: 25 mL via INTRAVENOUS

## 2016-04-11 MED ORDER — ACETAMINOPHEN 650 MG RE SUPP: 650.0000 mg | Freq: Four times a day (QID) | RECTAL | Status: DC | PRN

## 2016-04-11 MED ORDER — ASPIRIN EC 81 MG PO TBEC
81.0000 mg | DELAYED_RELEASE_TABLET | Freq: Every day | ORAL | Status: DC
  Administered 2016-04-11: 81 mg via ORAL
  Filled 2016-04-11: qty 1

## 2016-04-11 MED ORDER — HYDROMORPHONE HCL 1 MG/ML IJ SOLN
0.5000 mg | INTRAMUSCULAR | Status: DC | PRN
  Administered 2016-04-11 – 2016-04-12 (×2): 0.5 mg via INTRAVENOUS
  Filled 2016-04-11 (×3): qty 1

## 2016-04-11 MED ORDER — SODIUM CHLORIDE 0.9% FLUSH
10.0000 mL | Freq: Two times a day (BID) | INTRAVENOUS | Status: DC
  Administered 2016-04-11 – 2016-04-12 (×3): 10 mL
  Administered 2016-04-13: 20 mL
  Administered 2016-04-13 – 2016-04-14 (×2): 10 mL

## 2016-04-11 MED ORDER — HALOPERIDOL LACTATE 5 MG/ML IJ SOLN
INTRAMUSCULAR | Status: AC
  Filled 2016-04-11: qty 1

## 2016-04-11 MED ORDER — MIRTAZAPINE 15 MG PO TABS
30.0000 mg | ORAL_TABLET | Freq: Every day | ORAL | Status: DC
  Administered 2016-04-11 – 2016-04-13 (×3): 30 mg via ORAL
  Filled 2016-04-11 (×3): qty 2

## 2016-04-11 MED ORDER — RISPERIDONE 0.5 MG PO TABS
0.2500 mg | ORAL_TABLET | Freq: Two times a day (BID) | ORAL | Status: DC
  Administered 2016-04-11 (×2): 0.25 mg via ORAL
  Filled 2016-04-11 (×2): qty 1

## 2016-04-11 MED ORDER — PANTOPRAZOLE SODIUM 40 MG PO TBEC
40.0000 mg | DELAYED_RELEASE_TABLET | Freq: Every day | ORAL | Status: DC
  Administered 2016-04-11: 40 mg via ORAL
  Filled 2016-04-11: qty 1

## 2016-04-11 MED ORDER — HYDROMORPHONE HCL 1 MG/ML IJ SOLN
0.5000 mg | INTRAMUSCULAR | Status: DC | PRN
  Administered 2016-04-11: 0.5 mg via INTRAVENOUS
  Filled 2016-04-11: qty 1

## 2016-04-11 MED ORDER — PIPERACILLIN-TAZOBACTAM 3.375 G IVPB
3.3750 g | Freq: Two times a day (BID) | INTRAVENOUS | Status: DC
  Administered 2016-04-11 (×2): 3.375 g via INTRAVENOUS
  Filled 2016-04-11 (×4): qty 50

## 2016-04-11 MED ORDER — POLYETHYLENE GLYCOL 3350 17 GM/SCOOP PO POWD: 17.0000 g | Freq: Two times a day (BID) | ORAL | Status: DC | PRN

## 2016-04-11 MED ORDER — ATORVASTATIN CALCIUM 20 MG PO TABS
80.0000 mg | ORAL_TABLET | Freq: Every day | ORAL | Status: DC
  Administered 2016-04-11: 80 mg via ORAL
  Filled 2016-04-11: qty 4

## 2016-04-11 MED ORDER — ONDANSETRON HCL 4 MG PO TABS: 4.0000 mg | ORAL_TABLET | Freq: Four times a day (QID) | ORAL | Status: DC | PRN

## 2016-04-11 MED ORDER — HALOPERIDOL LACTATE 5 MG/ML IJ SOLN
2.0000 mg | Freq: Once | INTRAMUSCULAR | Status: AC
  Administered 2016-04-11: 2 mg via INTRAVENOUS

## 2016-04-11 MED ORDER — OXYCODONE HCL 5 MG PO TABS
10.0000 mg | ORAL_TABLET | Freq: Four times a day (QID) | ORAL | Status: DC | PRN
  Administered 2016-04-11 – 2016-04-14 (×4): 10 mg via ORAL
  Filled 2016-04-11 (×4): qty 2

## 2016-04-11 MED ORDER — ACETAMINOPHEN 325 MG PO TABS: 650.0000 mg | ORAL_TABLET | Freq: Four times a day (QID) | ORAL | Status: DC | PRN

## 2016-04-11 MED ORDER — NOREPINEPHRINE 4 MG/250ML-% IV SOLN
0.0000 ug/min | INTRAVENOUS | Status: DC
  Administered 2016-04-11: 6 ug/min via INTRAVENOUS
  Administered 2016-04-11: 2 ug/min via INTRAVENOUS
  Administered 2016-04-11: 4 ug/min via INTRAVENOUS
  Administered 2016-04-12: 6 ug/min via INTRAVENOUS
  Administered 2016-04-12: 8 ug/min via INTRAVENOUS
  Administered 2016-04-13: 2 ug/min via INTRAVENOUS
  Filled 2016-04-11 (×6): qty 250

## 2016-04-11 MED ORDER — BISACODYL 10 MG RE SUPP: 10.0000 mg | Freq: Every day | RECTAL | Status: DC | PRN

## 2016-04-11 MED ORDER — MUPIROCIN 2 % EX OINT
1.0000 "application " | TOPICAL_OINTMENT | Freq: Two times a day (BID) | CUTANEOUS | Status: DC
  Administered 2016-04-11 – 2016-04-14 (×6): 1 via NASAL
  Filled 2016-04-11 (×2): qty 22

## 2016-04-11 MED ORDER — ORAL CARE MOUTH RINSE
15.0000 mL | Freq: Two times a day (BID) | OROMUCOSAL | Status: DC
  Administered 2016-04-11 – 2016-04-14 (×5): 15 mL via OROMUCOSAL

## 2016-04-11 MED ORDER — HEPARIN SODIUM (PORCINE) 5000 UNIT/ML IJ SOLN
5000.0000 [IU] | Freq: Three times a day (TID) | INTRAMUSCULAR | Status: DC
  Administered 2016-04-11 – 2016-04-12 (×5): 5000 [IU] via SUBCUTANEOUS
  Filled 2016-04-11 (×5): qty 1

## 2016-04-11 MED ORDER — INFLUENZA VAC SPLIT QUAD 0.5 ML IM SUSY: 0.5000 mL | PREFILLED_SYRINGE | INTRAMUSCULAR | Status: DC

## 2016-04-11 MED ORDER — AMOXICILLIN-POT CLAVULANATE 875-125 MG PO TABS
1.0000 | ORAL_TABLET | Freq: Two times a day (BID) | ORAL | Status: DC
  Administered 2016-04-11: 1 via ORAL
  Filled 2016-04-11: qty 1

## 2016-04-11 MED ORDER — ONDANSETRON HCL 4 MG/2ML IJ SOLN: 4.0000 mg | Freq: Four times a day (QID) | INTRAMUSCULAR | Status: DC | PRN

## 2016-04-11 MED ORDER — SODIUM CHLORIDE 0.9 % IV SOLN
INTRAVENOUS | Status: DC
  Administered 2016-04-11 (×2): via INTRAVENOUS

## 2016-04-11 MED ORDER — DEXTROSE 50 % IV SOLN
INTRAVENOUS | Status: AC
  Administered 2016-04-11: 25 mL via INTRAVENOUS
  Filled 2016-04-11: qty 50

## 2016-04-11 MED ORDER — INSULIN ASPART 100 UNIT/ML ~~LOC~~ SOLN
0.0000 [IU] | Freq: Three times a day (TID) | SUBCUTANEOUS | Status: DC
  Administered 2016-04-11 – 2016-04-12 (×2): 2 [IU] via SUBCUTANEOUS
  Filled 2016-04-11 (×2): qty 2

## 2016-04-11 MED ORDER — POLYETHYLENE GLYCOL 3350 17 G PO PACK
17.0000 g | PACK | Freq: Two times a day (BID) | ORAL | Status: DC | PRN
  Administered 2016-04-14: 17 g via ORAL
  Filled 2016-04-11: qty 1

## 2016-04-11 MED ORDER — ALPRAZOLAM 0.5 MG PO TABS
0.5000 mg | ORAL_TABLET | Freq: Three times a day (TID) | ORAL | Status: DC | PRN
  Administered 2016-04-14: 0.5 mg via ORAL
  Filled 2016-04-11: qty 1

## 2016-04-11 NOTE — Care Management (Signed)
Admitted to [facility] with the diagnosis of acute kidney injury. Lives with brother. Son is Zenia Resides [phone]). Sees Dr. Guadelupe Sabin at [facility]. Followed by [place] August 2017. A resident of [facility] 02/08/16 and [facility] 03/05/16. Cane, rollator, and electric scooter in the home. Poor prognosis. Palliative Care ordered. Possible Hospice services in the home per Dr. Gardiner Coins progress notes. Shelbie Ammons RN MSN CCM Care Management [phone]

## 2016-04-11 NOTE — Progress Notes (Signed)
Inpatient Diabetes Program Recommendations  AACE/ADA: New Consensus Statement on Inpatient Glycemic Control (2015)  Target Ranges:  Prepandial:   less than 140 mg/dL      Peak postprandial:   less than 180 mg/dL (1-2 hours)      Critically ill patients:  140 - 180 mg/dL   Lab Results  Component Value Date   GLUCAP 96 04/11/2016   HGBA1C 9.8 (H) 02/06/2016    Review of Glycemic Control  Results for HERLIN, CROPLEY (MRN [id number]) as of 04/11/2016 08:29  Ref. Range 04/11/2016 00:29 04/11/2016 00:52 04/11/2016 07:20  Glucose-Capillary Latest Ref Range: 65 - 99 mg/dL 53 (L) 97 96    Diabetes history: Type 2 Outpatient Diabetes medications: Levemir 18 units qhs, Novolog 10 units tid, Glucophage 1500mg  qam, 1000mg  qpm Current orders for Inpatient glycemic control: Novolog sensitive correction 0-9 units tid, Novolog 0-5 units qhs  Inpatient Diabetes Program Recommendations:   Agree with current medications for blood sugar management.    Gentry Fitz, RN, BA, MHA, CDE Diabetes Coordinator Inpatient Diabetes Program  [phone] (Team Pager) [phone] ([place]) 04/11/2016 8:30 AM

## 2016-04-11 NOTE — Consult Note (Signed)
Consultation Note Date: 04/11/2016   Patient Name: Dennis Haynes  DOB: 1940/11/02  MRN: [id number]  Age / Sex: 75 y.o., male  PCP: Marygrace Drought, MD Referring Physician: Bettey Costa, MD  Reason for Consultation: Establishing goals of care  HPI/Patient Profile: 75 y.o. 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 H/o discitis. Also found bladder mass on renal ultrasound.   Clinical Assessment and Goals of Care: I met today with Mr. Spieler and his son, Zenia Resides. Mr. Neely was somewhat confused and did not participate in our conversation. Zenia Resides gives me a review of his father's history and attributes much of his decline to history of alcohol abuse and possible mixed with pain and sleep medications. He has declined over the past 2.5 months with progressive back pain, discitis, multiple falls and lack of mobility. Really is failing to thrive with mainly bed bound and pressure ulcer on sacrum.   Zenia Resides said that when his father was released from rehab he had no really progressed and Zenia Resides was taking care of him at home. Hospice was mentioned but they opted for home health. After Zenia Resides realized how poorly his father was doing he became more open to hospice but his father was admitted to hospital at this time.   We discussed goals now with concern over renal function and sepsis main concern. Zenia Resides is open to more comfort options but wishes for his siblings to arrive this weekend. He is not interested in aggressive/invasive measures including dialysis but would like to maintain supportive care (IVF, wean down vasopressors, antibiotics) while awaiting family arrival.  Discussed poor prognosis and hospice at home vs hospice facility options. COMFORT IS PRIORITY OVER ALL.   Primary Decision Maker HCPOA son Joshiah Traynham and then daughter Garlan Fillers to arrive this weekend    SUMMARY [place] is priority - No dialysis - Awaiting next 24-48 hours to see if he has any improvement while family arriving  Code Status/Advance Care Planning:  DNR   Symptom Management:   Acute on chronic back pain: OxyIR 10 mg every 6 hours prn. Dilaudid IV 0.5 mg increased to every 2 hours prn. May increase to achieve comfort.   Palliative Prophylaxis:   Aspiration, Bowel Regimen, Delirium Protocol, Frequent Pain Assessment and Turn Reposition  Additional Recommendations (Limitations, Scope, Preferences):  No Artificial Feeding, No Hemodialysis and No Surgical Procedures  Psycho-social/Spiritual:   Desire for further Chaplaincy support:no  Additional Recommendations: Caregiving  Support/Resources and Education on Hospice  Prognosis:   Prognosis could be <1-2 weeks if worsening renal function. Prognosis at best <6 months and eligible for hospice at home.   Discharge Planning: To Be Determined. Home hospice vs hospice facility.      Primary Diagnoses: Present on Admission: . AKI (acute kidney injury) ([place]) . Essential hypertension .  GERD (gastroesophageal reflux disease) . Anxiety . Bladder mass . Discitis of thoracic region   I have reviewed the medical record, interviewed the patient and family, and examined the patient. The following aspects are pertinent.  Past Medical History:  Diagnosis Date  . Anxiety   . Chronic back pain   . Dementia   . Diabetes ([place])   . Hypertension    Social History   Social History  . Marital status: Divorced    Spouse name: N/A  . Number of children: N/A  . Years of education: N/A   Social History Main Topics  . Smoking status: Current Every Day Smoker    Packs/day: 0.50    Types:  Cigarettes  . Smokeless tobacco: Never Used  . Alcohol use No  . Drug use: No  . Sexual activity: Not Asked   Other Topics Concern  . None   Social History Narrative  . None   Family History  Problem Relation Age of Onset  . Hypertension Other    Scheduled Meds: . amoxicillin-clavulanate  1 tablet Oral Q12H  . aspirin EC  81 mg Oral Daily  . atorvastatin  80 mg Oral Daily  . Chlorhexidine Gluconate Cloth  6 each Topical Q0600  . heparin  5,000 Units Subcutaneous Q8H  . [START ON 04/12/2016] Influenza vac split quadrivalent PF  0.5 mL Intramuscular Tomorrow-1000  . insulin aspart  0-5 Units Subcutaneous QHS  . insulin aspart  0-9 Units Subcutaneous TID WC  . mouth rinse  15 mL Mouth Rinse BID  . mirtazapine  30 mg Oral QHS  . mupirocin ointment  1 application Nasal BID  . pantoprazole  40 mg Oral Daily  . piperacillin-tazobactam (ZOSYN)  IV  3.375 g Intravenous Q12H  . [START ON 04/12/2016] pneumococcal 23 valent vaccine  0.5 mL Intramuscular Tomorrow-1000  . risperiDONE  0.25 mg Oral BID   Continuous Infusions: . norepinephrine 4 mcg/min (04/11/16 1604)   PRN Meds:.acetaminophen **OR** acetaminophen, ALPRAZolam, bisacodyl, HYDROmorphone (DILAUDID) injection, ondansetron **OR** ondansetron (ZOFRAN) IV, oxyCODONE, polyethylene glycol No Known Allergies Review of Systems  Unable to perform ROS: Dementia    Physical Exam  Constitutional: He appears well-developed and well-nourished. He appears ill.  HENT:  Head: Normocephalic and atraumatic.  Cardiovascular: Normal rate.   Pulmonary/Chest: Effort normal. No accessory muscle usage. No tachypnea. No respiratory distress.  Abdominal: Soft. Normal appearance.  Neurological: He is alert. He is disoriented.  Nursing note and vitals reviewed.   Vital Signs: BP (!) 93/47   Pulse 89   Temp 97.9 F (36.6 C) (Oral)   Resp 13   Ht 5' 10"  (1.778 m)   Wt 86.2 kg (190 lb)   SpO2 94%   BMI 27.26 kg/m  Pain Assessment: 0-10     Pain Score: Asleep   SpO2: SpO2: 94 % O2 Device:SpO2: 94 % O2 Flow Rate: .O2 Flow Rate (L/min): 2 L/min  IO: Intake/output summary:  Intake/Output Summary (Last 24 hours) at 04/11/16 1612 Last data filed at 04/11/16 1500  Gross per 24 hour  Intake          2351.49 ml  Output             1320 ml  Net          1031.49 ml    LBM: Last BM Date:  (patient stated that he does no know.) Baseline Weight: Weight: 86.2 kg (190 lb) Most recent weight: Weight: 86.2 kg (190 lb)     Palliative  Assessment/Data: PPS: 10%     Time In: 1500 Time Out: 1620 Time Total: 9mn Greater than 50%  of this time was spent counseling and coordinating care related to the above assessment and plan.  Signed by: AVinie Sill NP Palliative Medicine Team Pager # [phone](M-F 8a-5p) Team Phone # [phone](Nights/Weekends)

## 2016-04-11 NOTE — Progress Notes (Signed)
Pharmacy Antibiotic Note  Dennis Haynes is a 75 y.o. male with a h/o DM and HTN  admitted on 04/10/2016 with hypotension and weakness. Patient on Augmentin for discitis PTA admitted with septic shock and RML PNA. Pharmacy has been consulted for Zosyn dosing.  Plan: Zosyn 3.375g IV q12h (4 hour infusion).  Height: 5\' 10"  (177.8 cm) Weight: 190 lb (86.2 kg) IBW/kg (Calculated) : 73  Temp (24hrs), Avg:97.5 F (36.4 C), Min:96.8 F (36 C), Max:97.9 F (36.6 C)   Recent Labs Lab 04/10/16 1855 04/11/16 0924 04/11/16 1235  WBC 8.5 8.6  --   CREATININE 3.84* 3.95*  --   LATICACIDVEN  --  2.9* 2.6*    Estimated Creatinine Clearance: 16.4 mL/min (by C-G formula based on SCr of 3.95 mg/dL (H)).    No Known Allergies  Antimicrobials this admission: Augmentin 11/2 >> x 1 and PTA for discitis Zosyn 11/2 >>  Dose adjustments this admission:   Microbiology results: 11/2 MRSA PCR: positive  Thank you for allowing pharmacy to be a part of this patient's care.  Napoleon Form 04/11/2016 4:39 PM

## 2016-04-11 NOTE — Procedures (Signed)
Central Venous Catheter Insertion Procedure Note Dennis Haynes [id number] November 11, 1940  Procedure: Insertion of Central Venous Catheter Indications: Assessment of intravascular volume, Drug and/or fluid administration and Frequent blood sampling  Procedure Details Consent: Risks of procedure as well as the alternatives and risks of each were explained to the (patient/caregiver).  Consent for procedure obtained.  ation, verified procedure, site/side was marked, verified correct patient position, special equipment/implants available, medications/allergies/relevent history reviewed, required imaging and test results available.  Performed  Maximum sterile technique was used including antiseptics, cap, gloves, gown, hand hygiene, mask and sheet. Skin prep: Chlorhexidine; local anesthetic administered A antimicrobial bonded/coated triple lumen catheter was placed in the right internal jugular vein using the Seldinger technique.  Evaluation Blood flow good Complications: No apparent complications Patient did tolerate procedure well. Chest X-ray ordered to verify placement.  CXR: pending.  Right internal jugular central line placed utilizing ultrasound chest xray pending  Marda Stalker, [place] Pager [phone] (please enter 7 digits) [place] Pager [phone] (please enter 7 digits)

## 2016-04-11 NOTE — ED Notes (Signed)
Spoke with ICU RN Vevelyn Royals about changes in plan of care.

## 2016-04-11 NOTE — Consult Note (Signed)
Date: 04/11/2016                  Patient Name:  Dennis Haynes  MRN: [id number]  DOB: 12/19/1940  Age / Sex: 75 y.o., male         PCP: Marygrace Drought, MD                 Service Requesting Consult: Internal medicine                 Reason for Consult: ARF            History of Present Illness: Patient is a 75 y.o. male with medical problems of Dementia, decubitus ulcers, Bladder mass, HTN, Discitis, who was admitted to [facility] on 04/10/2016 for evaluation of hypotension, generalized weakness.   Patient has dementia and is not able to provide meaningful information. He is nursing home resident and had multiple decubitus ulcers He recently was diagnosed with paraspinal abscess in sep 2017. He was treated with brad spectrum Abx.  He was brought to the hospital for confusion, lethargy and hypotension Baseline Ct 0.89 Admit Cr 3.84 UOP poor Foley in place    Medications: Outpatient medications: Prescriptions Prior to Admission  Medication Sig Dispense Refill Last Dose  . acetaminophen (TYLENOL) 325 MG tablet Take 2 tablets (650 mg total) by mouth every 6 (six) hours as needed for mild pain (or Fever >/= 101).     Marland Kitchen ALPRAZolam (XANAX) 0.5 MG tablet Take 1 tablet (0.5 mg total) by mouth 3 (three) times daily as needed for anxiety. 30 tablet 0   . aspirin EC 81 MG tablet Take 1 tablet by mouth daily.   02/27/2016 at Unknown time  . atorvastatin (LIPITOR) 80 MG tablet Take 80 mg by mouth daily.   Past Week at Unknown time  . bisacodyl (DULCOLAX) 10 MG suppository Place 1 suppository (10 mg total) rectally daily as needed for moderate constipation (now). 12 suppository 0   . hydrochlorothiazide (HYDRODIURIL) 25 MG tablet Take 25 mg by mouth daily.   02/27/2016 at Unknown time  . Insulin Detemir (LEVEMIR FLEXPEN) 100 UNIT/ML Pen Inject 18 Units into the skin at bedtime. 15 mL 11   . metFORMIN (GLUCOPHAGE) 500 MG tablet Take by mouth 2 (two) times daily. 3 tablets every morning - 2 tablets every  evening.     . metoprolol (LOPRESSOR) 50 MG tablet Take 50 mg by mouth 2 (two) times daily.   Not Taking at Unknown time  . mirtazapine (REMERON) 30 MG tablet Take 1 tablet (30 mg total) by mouth at bedtime. 30 tablet 0   . NOVOLOG FLEXPEN 100 UNIT/ML FlexPen Inject 10 Units into the skin 3 (three) times daily with meals. Up to additional 20  Units per day per sliding scale.     Marland Kitchen omeprazole (PRILOSEC) 40 MG capsule Take 40 mg by mouth daily.   02/27/2016 at Unknown time  . Oxycodone HCl 10 MG TABS Take 1 tablet by mouth every 6 (six) hours as needed.     . polyethylene glycol (MIRALAX / GLYCOLAX) packet Take 17 g by mouth 2 (two) times daily. 14 each 0   . polyethylene glycol powder (GLYCOLAX/MIRALAX) powder Take 17 g by mouth 2 (two) times daily as needed.  0   . pregabalin (LYRICA) 150 MG capsule Take 300 mg by mouth 2 (two) times daily.    02/27/2016 at Unknown time  . RA VITAMIN B-12 TR 1000 MCG TBCR Take 1 tablet by  mouth daily.  0   . risperiDONE (RISPERDAL) 0.25 MG tablet Take 1 tablet (0.25 mg total) by mouth 2 (two) times daily. 60 tablet 0   . senna (SENOKOT) 8.6 MG TABS tablet Take 1 tablet (8.6 mg total) by mouth 2 (two) times daily. 120 each 0   . sertraline (ZOLOFT) 100 MG tablet Take 100 mg by mouth daily.   Past Week at Unknown time  . traMADol (ULTRAM) 50 MG tablet Take 50 mg by mouth every 6 (six) hours as needed.     . vitamin B-12 1000 MCG tablet Take 1 tablet (1,000 mcg total) by mouth daily.     Marland Kitchen zolpidem (AMBIEN) 5 MG tablet Take 1 tablet (5 mg total) by mouth at bedtime as needed for sleep. 30 tablet 0     Current medications: Current Facility-Administered Medications  Medication Dose Route Frequency Provider Last Rate Last Dose  . acetaminophen (TYLENOL) tablet 650 mg  650 mg Oral Q6H PRN Lance Coon, MD       Or  . acetaminophen (TYLENOL) suppository 650 mg  650 mg Rectal Q6H PRN Lance Coon, MD      . ALPRAZolam Duanne Moron) tablet 0.5 mg  0.5 mg Oral TID PRN Lance Coon, MD      . amoxicillin-clavulanate (AUGMENTIN) 875-125 MG per tablet 1 tablet  1 tablet Oral Q12H Lance Coon, MD   1 tablet at 04/11/16 [phone]  . aspirin EC tablet 81 mg  81 mg Oral Daily Lance Coon, MD   81 mg at 04/11/16 0954  . atorvastatin (LIPITOR) tablet 80 mg  80 mg Oral Daily Lance Coon, MD   80 mg at 04/11/16 0953  . bisacodyl (DULCOLAX) suppository 10 mg  10 mg Rectal Daily PRN Lance Coon, MD      . Chlorhexidine Gluconate Cloth 2 % PADS 6 each  6 each Topical Q0600 Flora Lipps, MD      . heparin injection 5,000 Units  5,000 Units Subcutaneous Q8H Lance Coon, MD   5,000 Units at 04/11/16 1422  . HYDROmorphone (DILAUDID) injection 0.5 mg  0.5 mg Intravenous Q4H PRN Bettey Costa, MD   0.5 mg at 04/11/16 1422  . [START ON 04/12/2016] Influenza vac split quadrivalent PF (FLUARIX) injection 0.5 mL  0.5 mL Intramuscular Tomorrow-1000 Lance Coon, MD      . insulin aspart (novoLOG) injection 0-5 Units  0-5 Units Subcutaneous QHS Lance Coon, MD      . insulin aspart (novoLOG) injection 0-9 Units  0-9 Units Subcutaneous TID WC Lance Coon, MD      . MEDLINE mouth rinse  15 mL Mouth Rinse BID Lance Coon, MD   15 mL at 04/11/16 1000  . mirtazapine (REMERON) tablet 30 mg  30 mg Oral QHS Lance Coon, MD   30 mg at 04/11/16 0100  . mupirocin ointment (BACTROBAN) 2 % 1 application  1 application Nasal BID Flora Lipps, MD   1 application at [phone]  . norepinephrine (LEVOPHED) 4mg  in D5W 250mL premix infusion  0-40 mcg/min Intravenous Titrated Lance Coon, MD 15 mL/hr at 04/11/16 1604 4 mcg/min at 04/11/16 1604  . ondansetron (ZOFRAN) tablet 4 mg  4 mg Oral Q6H PRN Lance Coon, MD       Or  . ondansetron [facility]) injection 4 mg  4 mg Intravenous Q6H PRN Lance Coon, MD      . oxyCODONE (Oxy IR/ROXICODONE) immediate release tablet 10 mg  10 mg Oral Q6H PRN Lance Coon, MD  10 mg at 04/11/16 1003  . pantoprazole (PROTONIX) EC tablet 40 mg  40 mg Oral Daily Lance Coon, MD    40 mg at 04/11/16 1000  . piperacillin-tazobactam (ZOSYN) IVPB 3.375 g  3.375 g Intravenous Q12H Napoleon Form, RPH   3.375 g at 04/11/16 1436  . [START ON 04/12/2016] pneumococcal 23 valent vaccine (PNU-IMMUNE) injection 0.5 mL  0.5 mL Intramuscular Tomorrow-1000 Lance Coon, MD      . polyethylene glycol [facility] / GLYCOLAX) packet 17 g  17 g Oral BID PRN Lance Coon, MD      . risperiDONE (RISPERDAL) tablet 0.25 mg  0.25 mg Oral BID Lance Coon, MD   0.25 mg at 04/11/16 [id number]      Allergies: No Known Allergies    Past Medical History: Past Medical History:  Diagnosis Date  . Anxiety   . Chronic back pain   . Dementia   . Diabetes ([place])   . Hypertension      Past Surgical History: Past Surgical History:  Procedure Laterality Date  . APPENDECTOMY    . [place] ASPIRATION  02/27/2016   THORACIC  . IR GENERIC HISTORICAL  02/28/2016   IR FLUORO GUIDED NEEDLE PLC ASPIRATION/INJECTION LOC 02/28/2016 Arne Cleveland, MD [facility]-INTERV RAD     Family History: Family History  Problem Relation Age of Onset  . Hypertension Other      Social History: Social History   Social History  . Marital status: Divorced    Spouse name: N/A  . Number of children: N/A  . Years of education: N/A   Occupational History  . Not on file.   Social History Main Topics  . Smoking status: Current Every Day Smoker    Packs/day: 0.50    Types: Cigarettes  . Smokeless tobacco: Never Used  . Alcohol use No  . Drug use: No  . Sexual activity: Not on file   Other Topics Concern  . Not on file   Social History Narrative  . No narrative on file     Review of Systems: unreliable due to dementia Gen:  HEENT:  CV:  Resp:  GI: GU :  MS:  Derm:   Psych: Heme:  Neuro:  Endocrine  Vital Signs: Blood pressure (!) 93/47, pulse 89, temperature 97.9 F (36.6 C), temperature source Oral, resp. rate 13, height 5\' 10"  (1.778 m), weight 86.2 kg (190 lb), SpO2 94 %.   Intake/Output Summary  (Last 24 hours) at 04/11/16 1605 Last data filed at 04/11/16 1500  Gross per 24 hour  Intake          2351.49 ml  Output             1320 ml  Net          1031.49 ml    Weight trends: Filed Weights   04/10/16 1845  Weight: 86.2 kg (190 lb)    Physical Exam: General:  disheveled, laying in bed  HEENT Anicteric, dry mucus membranes  Neck: supple   Lungs: No wheezing and crackles, normal effort  Heart:: irregular   Abdomen: Soft, NT  Extremities:  no edema, soft boot supports  Neurologic: Alert, able to answer questions and follow simple commands  Skin: Decreased turgor     Foley: present       Lab results: Basic Metabolic Panel:  Recent Labs Lab 04/10/16 1855 04/11/16 0924  NA 139 140  K 4.2 4.5  CL 99* 106  CO2 23 17*  GLUCOSE 47* 107*  BUN 61* 61*  CREATININE 3.84* 3.95*  CALCIUM 8.6* 8.0*  MG  --  1.5*    Liver Function Tests:  Recent Labs Lab 04/10/16 1855  AST 51*  ALT 17  ALKPHOS 121  BILITOT <0.1*  PROT 6.4*  ALBUMIN 2.2*   No results for input(s): LIPASE, AMYLASE in the last 168 hours. No results for input(s): AMMONIA in the last 168 hours.  CBC:  Recent Labs Lab 04/10/16 1855 04/11/16 0924  WBC 8.5 8.6  NEUTROABS 5.9  --   HGB 11.6* 11.6*  HCT 35.6* 35.8*  MCV 77.3* 77.8*  PLT 267 260    Cardiac Enzymes: No results for input(s): CKTOTAL, TROPONINI in the last 168 hours.  BNP: Invalid input(s): POCBNP  CBG:  Recent Labs Lab 04/11/16 0029 04/11/16 0052 04/11/16 0720 04/11/16 1124  GLUCAP 53* 97 96 117*    Microbiology: Recent Results (from the past 720 hour(s))  MRSA PCR Screening     Status: Abnormal   Collection Time: 04/11/16 12:24 AM  Result Value Ref Range Status   MRSA by PCR POSITIVE (A) NEGATIVE Final    Comment:        The GeneXpert MRSA Assay (FDA approved for NASAL specimens only), is one component of a comprehensive MRSA colonization surveillance program. It is not intended to diagnose  MRSA infection nor to guide or monitor treatment for MRSA infections. RESULT CALLED TO, READ BACK BY AND VERIFIED WITH: Chong Sicilian RN AT 0140 04/11/16 MSS.      Coagulation Studies: No results for input(s): LABPROT, INR in the last 72 hours.  Urinalysis:  Recent Labs  04/10/16 2235  COLORURINE AMBER*  LABSPEC 1.018  PHURINE 5.0  GLUCOSEU 50*  HGBUR 2+*  BILIRUBINUR NEGATIVE  KETONESUR NEGATIVE  PROTEINUR 100*  NITRITE NEGATIVE  LEUKOCYTESUR NEGATIVE        Imaging: US Renal  Result Date: 04/11/2016 CLINICAL DATA:  Acute kidney injury with sepsis and hypotension. EXAM: RENAL / URINARY TRACT ULTRASOUND COMPLETE COMPARISON:  CT 02/27/2016 FINDINGS: Right Kidney: Length: 11.0 cm. Mild increase in renal echogenicity. Parenchymal thickness is preserved. No hydronephrosis. Midpole cyst is 1.5 x 1.5 x 1.4 cm. Left Kidney: Length: 10.7 cm. Mildly echogenic renal parenchyma. Parenchymal thickness is preserved. Lower pole cyst is 1.9 x 2.3 x 2.2 cm. A second midpole cyst is 2.3 x 2.3 x 2.6 cm. No solid mass or hydronephrosis. Bladder: Bladder is decompressed by a Foley catheter. IMPRESSION: 1. Mildly echogenic kidneys. 2. Normal renal parenchymal thickness. 3. Bilateral renal cysts.  No hydronephrosis. Electronically Signed   By: Nolon Nations M.D.   On: 04/11/2016 15:13   Dg Chest Port 1 View  Result Date: 04/10/2016 CLINICAL DATA:  Chronic generalized weakness. Cough and hypotension. Initial encounter. EXAM: PORTABLE CHEST 1 VIEW COMPARISON:  Chest radiograph performed 02/27/2016 FINDINGS: The lungs are well-aerated. Mild vascular congestion is noted. Minimal right midlung opacity could reflect pneumonia. There is no evidence of pleural effusion or pneumothorax. The cardiomediastinal silhouette is within normal limits. No acute osseous abnormalities are seen. IMPRESSION: Minimal right midlung opacity could reflect pneumonia. Mild vascular congestion seen. Electronically Signed   By:  Garald Balding M.D.   On: 04/10/2016 22:49      Assessment & Plan: Pt is a 75 y.o. male with Dementia, decubitus ulcers, Bladder mass, HTN, Discitis, who was admitted to [facility] on 04/10/2016 for evaluation of hypotension, generalized weakness, Enterococcus paraspinal abscess in sep 2017  1. ARF Likely secondary to ATN from dehydration, hypotension  and underlying illness UOP is poor S Cr has increased to 3.95 Palliative care team has discussed with family. Due to multiple underlying illnesses, dementia and poor general condition they do not want aggressive care such as dialysis. We agree with this decision.   Plan: Volume resuscitation Avoid hypotension  2 Acidosis - may supplement bicarb iv or orally

## 2016-04-11 NOTE — Consult Note (Signed)
PULMONARY / CRITICAL CARE MEDICINE   Name: Dennis Haynes MRN: [id number] DOB: May 19, 1941    ADMISSION DATE:  04/10/2016 CONSULTATION DATE:  04/11/16  REFERRING MD: Dr. Lance Coon  CHIEF COMPLAINT:  Hypotension  HISTORY OF PRESENT ILLNESS:   Dennis Haynes is a 75 yo male with medical history significant for chronic back pain, Dementia, Diabetes and hypertension. Patient presents to the ED on 11/1 with confusion,lethargy and low BP.Patient was recently discharged from rehabilitation  Facility. Patient has not been active since then.  Patient was treated for discitis and is still on oral antibiotics for the same.Patient was very lethargic and when son checked his BP,he found it to be low and brought him to ED.He initially responded well to crystalloids.  PCCM was consulted for further recommendation.  PAST MEDICAL HISTORY :  He  has a past medical history of Anxiety; Chronic back pain; Dementia; Diabetes ([place]); and Hypertension.  PAST SURGICAL HISTORY: He  has a past surgical history that includes Appendectomy; ir generic historical (02/28/2016); and [place] (02/27/2016).  No Known Allergies  No current facility-administered medications on file prior to encounter.    Current Outpatient Prescriptions on File Prior to Encounter  Medication Sig  . acetaminophen (TYLENOL) 325 MG tablet Take 2 tablets (650 mg total) by mouth every 6 (six) hours as needed for mild pain (or Fever >/= 101).  Marland Kitchen ALPRAZolam (XANAX) 0.5 MG tablet Take 1 tablet (0.5 mg total) by mouth 3 (three) times daily as needed for anxiety.  Marland Kitchen aspirin EC 81 MG tablet Take 1 tablet by mouth daily.  Marland Kitchen atorvastatin (LIPITOR) 80 MG tablet Take 80 mg by mouth daily.  . bisacodyl (DULCOLAX) 10 MG suppository Place 1 suppository (10 mg total) rectally daily as needed for moderate constipation (now).  . hydrochlorothiazide (HYDRODIURIL) 25 MG tablet Take 25 mg by mouth daily.  . Insulin Detemir (LEVEMIR FLEXPEN) 100 UNIT/ML Pen Inject 18  Units into the skin at bedtime.  . metoprolol (LOPRESSOR) 50 MG tablet Take 50 mg by mouth 2 (two) times daily.  . mirtazapine (REMERON) 30 MG tablet Take 1 tablet (30 mg total) by mouth at bedtime.  Marland Kitchen omeprazole (PRILOSEC) 40 MG capsule Take 40 mg by mouth daily.  . polyethylene glycol (MIRALAX / GLYCOLAX) packet Take 17 g by mouth 2 (two) times daily.  . pregabalin (LYRICA) 150 MG capsule Take 300 mg by mouth 2 (two) times daily.   . risperiDONE (RISPERDAL) 0.25 MG tablet Take 1 tablet (0.25 mg total) by mouth 2 (two) times daily.  Marland Kitchen senna (SENOKOT) 8.6 MG TABS tablet Take 1 tablet (8.6 mg total) by mouth 2 (two) times daily.  . sertraline (ZOLOFT) 100 MG tablet Take 100 mg by mouth daily.  . vitamin B-12 1000 MCG tablet Take 1 tablet (1,000 mcg total) by mouth daily.  Marland Kitchen zolpidem (AMBIEN) 5 MG tablet Take 1 tablet (5 mg total) by mouth at bedtime as needed for sleep.    FAMILY HISTORY:  His indicated that the status of his other is unknown.    SOCIAL HISTORY: He  reports that he has been smoking Cigarettes.  He has been smoking about 0.50 packs per day. He has never used smokeless tobacco. He reports that he does not drink alcohol or use drugs.  REVIEW OF SYSTEMS:   Unable to obtain  SUBJECTIVE:  Unable to obtain  VITAL SIGNS: BP (!) 68/47   Pulse 82   Temp (!) 96.8 F (36 C) (Rectal)   Resp 13  Ht 5\' 10"  (1.778 m)   Wt 86.2 kg (190 lb)   SpO2 92%   BMI 27.26 kg/m   HEMODYNAMICS:    VENTILATOR SETTINGS:    INTAKE / OUTPUT: No intake/output data recorded.  PHYSICAL EXAMINATION: General:  Sickly appearing , elderly gentleman Neuro:  confused HEENT:  Atraumatic, Normocephalic, no discharge, no JVD appreciated Cardiovascular: S1S2,RRR, NO mrg noted Lungs: clear bilaterally, no wheezes, crackles, rhonchi noted Abdomen :soft, nontender, active bowel sounds Musculoskeletal:  No inflammation/ deformity noted Skin:  Grossly intact  LABS:  BMET  Recent Labs Lab  04/10/16 1855  NA 139  K 4.2  CL 99*  CO2 23  BUN 61*  CREATININE 3.84*  GLUCOSE 47*    Electrolytes  Recent Labs Lab 04/10/16 1855  CALCIUM 8.6*    CBC  Recent Labs Lab 04/10/16 1855  WBC 8.5  HGB 11.6*  HCT 35.6*  PLT 267    Coag's No results for input(s): APTT, INR in the last 168 hours.  Sepsis Markers No results for input(s): LATICACIDVEN, PROCALCITON, O2SATVEN in the last 168 hours.  ABG No results for input(s): PHART, PCO2ART, PO2ART in the last 168 hours.  Liver Enzymes  Recent Labs Lab 04/10/16 1855  AST 51*  ALT 17  ALKPHOS 121  BILITOT <0.1*  ALBUMIN 2.2*    Cardiac Enzymes No results for input(s): TROPONINI, PROBNP in the last 168 hours.  Glucose  Recent Labs Lab 04/11/16 0029  GLUCAP 53*    Imaging Dg Chest Port 1 View  Result Date: 04/10/2016 CLINICAL DATA:  Chronic generalized weakness. Cough and hypotension. Initial encounter. EXAM: PORTABLE CHEST 1 VIEW COMPARISON:  Chest radiograph performed 02/27/2016 FINDINGS: The lungs are well-aerated. Mild vascular congestion is noted. Minimal right midlung opacity could reflect pneumonia. There is no evidence of pleural effusion or pneumothorax. The cardiomediastinal silhouette is within normal limits. No acute osseous abnormalities are seen. IMPRESSION: Minimal right midlung opacity could reflect pneumonia. Mild vascular congestion seen. Electronically Signed   By: Garald Balding M.D.   On: 04/10/2016 22:49     STUDIES:  none  CULTURES: none  ANTIBIOTICS: 04/11/16 Augmentin>>  SIGNIFICANT EVENTS:  11/2>>Patient admitted with hypotension on very low dose levophed.  On RA  LINES/TUBES: NONE  DISCUSSION: 75 yo male admitted with hypotension requiring low dose pressors  ASSESSMENT / PLAN:  PULMONARY A: No active issues P:   On RA  CARDIOVASCULAR A:  Hypotension Shock Hx of hypertension P:  Continuous Telemetry Levo gtt Keep MAP goals>65 Hold  metoprolol/hydrochlorothiazide Continue Aspirin Continue lipitor  RENAL A:   Acute kidney injury Bladder mass- incidental finding on CT P:   Hydrate the patient Strict I/o Follow chemistry Replace electrolytes per ICU protocol May need urology follow up in the outpatient setting  GASTROINTESTINAL A:   Hx of GERD P:   Continue Protonix  HEMATOLOGIC A:   No active issues P:  Heparin for DVT prophylaxis  INFECTIOUS A:   Discitis Sepsis - highly unlikely  P:   Continue Augmentin Monitor fever curve  ENDOCRINE A:   DM P:   BS checks ACHS SSI Coverage   NEUROLOGIC A:   Altered Mental status Hx of anxiety Hx of Dementia P:   Minimize sedating medications Reorient freequently Rest per primary    Bincy Varughese,AG-ACNP Pulmonary and [place]   04/11/2016, 12:51 AM

## 2016-04-11 NOTE — Progress Notes (Signed)
Spoke with Hinton Dyer regarding patients lactic acid. She is aware.

## 2016-04-11 NOTE — Progress Notes (Signed)
MD made aware of patient not making any urine so far this morning. Order for Nephrology consult initiated. Will continue to monitor.

## 2016-04-11 NOTE — Progress Notes (Signed)
Family Meeting Note  Advance Directive:yes  Today a meeting took place with the patient's son , Zenia Resides Dr Gerhold is unable to participate due to:AMS/dementia  The following clinical team members were present during this meeting Dr Benjie Karvonen  The following were discussed:Patient's diagnosis: AKI wth sepsis and hypotension, Patient's progosis: fair prognosis  still on pressors and renal function still very poor and Goals for treatment: patient was supposed to be transitioned to [facility] at home. We will order [facility] consult  Patient is DNR  Additional follow-up to be provided: PC consult  Time spent during discussion:17 minutes  Adisen Bennion, MD

## 2016-04-11 NOTE — Progress Notes (Signed)
[facility] [place] NAME: Dennis Haynes    MR#:  [id number]  DATE OF BIRTH:  05/11/1941  SUBJECTIVE:   patient with dementia Patient drowsy feels weak No UOP since admission On Levo fed for blood pressure  REVIEW OF SYSTEMS:    Review of Systems  Constitutional: Positive for malaise/fatigue. Negative for chills and fever.  HENT: Negative.  Negative for ear discharge, ear pain, hearing loss, nosebleeds and sore throat.   Eyes: Negative.  Negative for blurred vision and pain.  Respiratory: Negative.  Negative for cough, hemoptysis, shortness of breath and wheezing.   Cardiovascular: Negative.  Negative for chest pain, palpitations and leg swelling.  Gastrointestinal: Negative.  Negative for abdominal pain, blood in stool, diarrhea, nausea and vomiting.  Genitourinary: Negative.  Negative for dysuria.       Anuria  Musculoskeletal: Negative.  Negative for back pain.  Skin: Negative.   Neurological: Positive for weakness. Negative for dizziness, tremors, speech change, focal weakness, seizures and headaches.  Endo/Heme/Allergies: Negative.  Does not bruise/bleed easily.  Psychiatric/Behavioral: Negative.  Negative for depression, hallucinations and suicidal ideas.    Tolerating Diet: yes      DRUG ALLERGIES:  No Known Allergies  VITALS:  Blood pressure (!) 119/39, pulse 87, temperature 97.9 F (36.6 C), temperature source Oral, resp. rate 19, height 5\' 10"  (1.778 m), weight 86.2 kg (190 lb), SpO2 92 %.  PHYSICAL EXAMINATION:   Physical Exam  Constitutional: He is well-developed, well-nourished, and in no distress. No distress.  Tired frail  HENT:  Head: Normocephalic.  Eyes: No scleral icterus.  Neck: Normal range of motion. Neck supple. No JVD present. No tracheal deviation present.  Cardiovascular: Normal rate, regular rhythm and normal heart sounds.  Exam reveals no gallop and no friction rub.   No murmur  heard. Pulmonary/Chest: Effort normal and breath sounds normal. No respiratory distress. He has no wheezes. He has no rales. He exhibits no tenderness.  Abdominal: Soft. Bowel sounds are normal. He exhibits no distension and no mass. There is no tenderness. There is no rebound and no guarding.  Musculoskeletal: Normal range of motion. He exhibits no edema.  Neurological: He is alert.  Skin: Skin is warm. No rash noted. No erythema.      LABORATORY PANEL:   CBC  Recent Labs Lab 04/11/16 0924  WBC 8.6  HGB 11.6*  HCT 35.8*  PLT 260   ------------------------------------------------------------------------------------------------------------------  Chemistries   Recent Labs Lab 04/10/16 1855 04/11/16 0924  NA 139 140  K 4.2 4.5  CL 99* 106  CO2 23 17*  GLUCOSE 47* 107*  BUN 61* 61*  CREATININE 3.84* 3.95*  CALCIUM 8.6* 8.0*  AST 51*  --   ALT 17  --   ALKPHOS 121  --   BILITOT <0.1*  --    ------------------------------------------------------------------------------------------------------------------  Cardiac Enzymes No results for input(s): TROPONINI in the last 168 hours. ------------------------------------------------------------------------------------------------------------------  RADIOLOGY:  Dg Chest Port 1 View  Result Date: 04/10/2016 CLINICAL DATA:  Chronic generalized weakness. Cough and hypotension. Initial encounter. EXAM: PORTABLE CHEST 1 VIEW COMPARISON:  Chest radiograph performed 02/27/2016 FINDINGS: The lungs are well-aerated. Mild vascular congestion is noted. Minimal right midlung opacity could reflect pneumonia. There is no evidence of pleural effusion or pneumothorax. The cardiomediastinal silhouette is within normal limits. No acute osseous abnormalities are seen. IMPRESSION: Minimal right midlung opacity could reflect pneumonia. Mild vascular congestion seen. Electronically Signed   By: Francoise Schaumann.D.  On: 04/10/2016 22:49      ASSESSMENT AND PLAN:    86 show male with diabetes and hypertension who presents with weakness and found to have hypotension and acute kidney injury.  1. Acute kidney injury with anuria: Nephrology consultation. Hold nephrotoxic agents. Renal ultrasound to evaluate for hydronephrosis and bladder mass that was seen on CT scan in September.  2. Septic shock with right middle lung lobe pneumonia: Continue levothyroid to keep map greater than 65 Follow up on blood culture 3. Acute metabolic encephalopathy in the setting of sepsis and acute kidney injury with history of dementia: Continue to monitor  4. Diabetes: Continue sliding scale insulin.  5. History of discitis (E. Faecelis) Continue Augmentin for 1 month from 10/24 ID consult 6. Hyperlipidemia: Continue atorvastatin   CODE STATUS: full  Critical care TOTAL TIME TAKING CARE OF THIS PATIENT: 30 minutes.   Patient still requiring pressors and is critically ill  POSSIBLE D/C ???, DEPENDING ON CLINICAL CONDITION.   Twilla Khouri M.D on 04/11/2016 at 11:20 AM  Between 7am to 6pm - Pager - [phone] After 6pm go to [email or web address] - password [email or web address] [place]  Office  [phone]  CC: Primary care physician; Marygrace Drought, MD  Note: This dictation was prepared with Dragon dictation along with smaller phrase technology. Any transcriptional errors that result from this process are unintentional.

## 2016-04-12 DIAGNOSIS — N3289 Other specified disorders of bladder: Secondary | ICD-10-CM

## 2016-04-12 DIAGNOSIS — M4644 Discitis, unspecified, thoracic region: Secondary | ICD-10-CM

## 2016-04-12 LAB — BASIC METABOLIC PANEL
Anion gap: 17 — ABNORMAL HIGH (ref 5–15)
BUN: 64 mg/dL — AB (ref 6–20)
CALCIUM: 7.6 mg/dL — AB (ref 8.9–10.3)
CO2: 17 mmol/L — ABNORMAL LOW (ref 22–32)
Chloride: 104 mmol/L (ref 101–111)
Creatinine, Ser: 5.03 mg/dL — ABNORMAL HIGH (ref 0.61–1.24)
GFR calc Af Amer: 12 mL/min — ABNORMAL LOW (ref >60)
GFR calc non Af Amer: 10 mL/min — ABNORMAL LOW (ref >60)
GLUCOSE: 194 mg/dL — AB (ref 65–99)
POTASSIUM: 4.9 mmol/L (ref 3.5–5.1)
SODIUM: 138 mmol/L (ref 135–145)

## 2016-04-12 LAB — CBC
HEMATOCRIT: 30.9 % — AB (ref 40.0–52.0)
Hemoglobin: 10.2 g/dL — ABNORMAL LOW (ref 13.0–18.0)
MCH: 25.4 pg — ABNORMAL LOW (ref 26.0–34.0)
MCHC: 32.8 g/dL (ref 32.0–36.0)
MCV: 77.4 fL — ABNORMAL LOW (ref 80.0–100.0)
PLATELETS: 257 10*3/uL (ref 150–440)
RBC: 4 MIL/uL — ABNORMAL LOW (ref 4.40–5.90)
RDW: 22 % — AB (ref 11.5–14.5)
WBC: 6 10*3/uL (ref 3.8–10.6)

## 2016-04-12 LAB — GLUCOSE, CAPILLARY
GLUCOSE-CAPILLARY: 199 mg/dL — AB (ref 65–99)
GLUCOSE-CAPILLARY: 163 mg/dL — AB (ref 65–99)
GLUCOSE-CAPILLARY: 163 mg/dL — AB (ref 65–99)
Glucose-Capillary: 157 mg/dL — ABNORMAL HIGH (ref 65–99)

## 2016-04-12 LAB — LACTIC ACID, PLASMA: Lactic Acid, Venous: 2.6 mmol/L (ref 0.5–1.9)

## 2016-04-12 LAB — HEMOGLOBIN A1C
Hgb A1c MFr Bld: 6.2 % — ABNORMAL HIGH (ref 4.8–5.6)
Mean Plasma Glucose: 131 mg/dL

## 2016-04-12 MED ORDER — PIPERACILLIN-TAZOBACTAM 3.375 G IVPB: 3.3750 g | Freq: Two times a day (BID) | INTRAVENOUS | Status: DC

## 2016-04-12 MED ORDER — INSULIN ASPART 100 UNIT/ML ~~LOC~~ SOLN
0.0000 [IU] | Freq: Three times a day (TID) | SUBCUTANEOUS | Status: DC
  Administered 2016-04-12 – 2016-04-13 (×5): 2 [IU] via SUBCUTANEOUS
  Administered 2016-04-14: 3 [IU] via SUBCUTANEOUS
  Filled 2016-04-12: qty 3
  Filled 2016-04-12 (×5): qty 2

## 2016-04-12 MED ORDER — SENNOSIDES-DOCUSATE SODIUM 8.6-50 MG PO TABS
1.0000 | ORAL_TABLET | Freq: Two times a day (BID) | ORAL | Status: DC
  Administered 2016-04-12 – 2016-04-13 (×2): 1 via ORAL
  Filled 2016-04-12 (×2): qty 1

## 2016-04-12 MED ORDER — HYDROMORPHONE HCL 1 MG/ML IJ SOLN
1.0000 mg | INTRAMUSCULAR | Status: DC | PRN
  Administered 2016-04-12 – 2016-04-14 (×9): 1 mg via INTRAVENOUS
  Filled 2016-04-12 (×10): qty 1

## 2016-04-12 MED ORDER — HYDROMORPHONE HCL 1 MG/ML IJ SOLN
0.5000 mg | Freq: Once | INTRAMUSCULAR | Status: AC
  Administered 2016-04-12: 0.5 mg via INTRAVENOUS

## 2016-04-12 MED ORDER — INSULIN ASPART 100 UNIT/ML ~~LOC~~ SOLN: 0.0000 [IU] | Freq: Every day | SUBCUTANEOUS | Status: DC

## 2016-04-12 MED ORDER — HYDROMORPHONE HCL 1 MG/ML IJ SOLN
1.0000 mg | INTRAMUSCULAR | Status: AC
  Administered 2016-04-12: 1 mg via INTRAVENOUS

## 2016-04-12 NOTE — Progress Notes (Signed)
[facility]  ROUNDING NOTE   Subjective:   UOP 35 Creatinine 5.03 (3.95) CO2 17  Norepinephrine gtt NS at 171mL/hr  Objective:  Vital signs in last 24 hours:  Temp:  [98.6 F (37 C)-98.7 F (37.1 C)] 98.6 F (37 C) (11/03 0200) Pulse Rate:  [82-102] 87 (11/03 0630) Resp:  [10-19] 16 (11/03 0630) BP: (70-119)/(41-90) 97/54 (11/03 0630) SpO2:  [92 %-100 %] 94 % (11/03 0630)  Weight change:  Filed Weights   04/10/16 1845  Weight: 86.2 kg (190 lb)    Intake/Output: I/O last 3 completed shifts: In: 3840.4 [P.O.:120; I.V.:2620.4; IV Piggyback:1100] Out: 1335 [Urine:1335]   Intake/Output this shift:  No intake/output data recorded.  Physical Exam: General: Critically ill  Head: Normocephalic, atraumatic. Moist oral mucosal membranes  Eyes: Anicteric, PERRL  Neck: Supple, trachea midline  Lungs:  Clear to auscultation  Heart: Regular rate and rhythm  Abdomen:  Soft, nontender,   Extremities: trace peripheral edema.  Neurologic: Nonfocal, moving all four extremities  Skin: No lesions       Basic Metabolic Panel:  Recent Labs Lab 04/10/16 1855 04/11/16 0924 04/12/16 0446  NA 139 140 138  K 4.2 4.5 4.9  CL 99* 106 104  CO2 23 17* 17*  GLUCOSE 47* 107* 194*  BUN 61* 61* 64*  CREATININE 3.84* 3.95* 5.03*  CALCIUM 8.6* 8.0* 7.6*  MG  --  1.5*  --     Liver Function Tests:  Recent Labs Lab 04/10/16 1855  AST 51*  ALT 17  ALKPHOS 121  BILITOT <0.1*  PROT 6.4*  ALBUMIN 2.2*   No results for input(s): LIPASE, AMYLASE in the last 168 hours. No results for input(s): AMMONIA in the last 168 hours.  CBC:  Recent Labs Lab 04/10/16 1855 04/11/16 0924 04/12/16 0446  WBC 8.5 8.6 6.0  NEUTROABS 5.9  --   --   HGB 11.6* 11.6* 10.2*  HCT 35.6* 35.8* 30.9*  MCV 77.3* 77.8* 77.4*  PLT 267 260 257    Cardiac Enzymes: No results for input(s): CKTOTAL, CKMB, CKMBINDEX, TROPONINI in the last 168 hours.  BNP: Invalid input(s):  POCBNP  CBG:  Recent Labs Lab 04/11/16 0720 04/11/16 1124 04/11/16 1634 04/11/16 2159 04/12/16 0749  GLUCAP 96 117* 167* 176* 199*    Microbiology: Results for orders placed or performed during the hospital encounter of 04/10/16  MRSA PCR Screening     Status: Abnormal   Collection Time: 04/11/16 12:24 AM  Result Value Ref Range Status   MRSA by PCR POSITIVE (A) NEGATIVE Final    Comment:        The GeneXpert MRSA Assay (FDA approved for NASAL specimens only), is one component of a comprehensive MRSA colonization surveillance program. It is not intended to diagnose MRSA infection nor to guide or monitor treatment for MRSA infections. RESULT CALLED TO, READ BACK BY AND VERIFIED WITH: Chong Sicilian RN AT 0140 04/11/16 MSS.     Coagulation Studies: No results for input(s): LABPROT, INR in the last 72 hours.  Urinalysis:  Recent Labs  04/10/16 2235  COLORURINE AMBER*  LABSPEC 1.018  PHURINE 5.0  GLUCOSEU 50*  HGBUR 2+*  BILIRUBINUR NEGATIVE  KETONESUR NEGATIVE  PROTEINUR 100*  NITRITE NEGATIVE  LEUKOCYTESUR NEGATIVE      Imaging: US Renal  Result Date: 04/11/2016 CLINICAL DATA:  Acute kidney injury with sepsis and hypotension. EXAM: RENAL / URINARY TRACT ULTRASOUND COMPLETE COMPARISON:  CT 02/27/2016 FINDINGS: Right Kidney: Length: 11.0 cm. Mild increase in  renal echogenicity. Parenchymal thickness is preserved. No hydronephrosis. Midpole cyst is 1.5 x 1.5 x 1.4 cm. Left Kidney: Length: 10.7 cm. Mildly echogenic renal parenchyma. Parenchymal thickness is preserved. Lower pole cyst is 1.9 x 2.3 x 2.2 cm. A second midpole cyst is 2.3 x 2.3 x 2.6 cm. No solid mass or hydronephrosis. Bladder: Bladder is decompressed by a Foley catheter. IMPRESSION: 1. Mildly echogenic kidneys. 2. Normal renal parenchymal thickness. 3. Bilateral renal cysts.  No hydronephrosis. Electronically Signed   By: Nolon Nations M.D.   On: 04/11/2016 15:13   Dg Chest Port 1 View  Result  Date: 04/11/2016 CLINICAL DATA:  Central line placement. EXAM: PORTABLE CHEST 1 VIEW COMPARISON:  04/10/2016. FINDINGS: Right jugular catheter tip at the superior cavoatrial junction. No pneumothorax. Interval borderline enlargement of the cardiac silhouette, accentuated by a poor inspiration and the portable AP technique. Aortic calcifications are noted. There is a mild amount of patchy opacity at the left lung base. The right lung is clear. The interstitial markings remain mildly prominent. Thoracic spine degenerative changes. IMPRESSION: 1. Right jugular catheter tip at the superior cavoatrial junction. No pneumothorax. 2. Mild left basilar atelectasis or pneumonia. 3. Borderline cardiomegaly. 4. Aortic atherosclerosis. Electronically Signed   By: Claudie Revering M.D.   On: 04/11/2016 19:05   Dg Chest Port 1 View  Result Date: 04/10/2016 CLINICAL DATA:  Chronic generalized weakness. Cough and hypotension. Initial encounter. EXAM: PORTABLE CHEST 1 VIEW COMPARISON:  Chest radiograph performed 02/27/2016 FINDINGS: The lungs are well-aerated. Mild vascular congestion is noted. Minimal right midlung opacity could reflect pneumonia. There is no evidence of pleural effusion or pneumothorax. The cardiomediastinal silhouette is within normal limits. No acute osseous abnormalities are seen. IMPRESSION: Minimal right midlung opacity could reflect pneumonia. Mild vascular congestion seen. Electronically Signed   By: Garald Balding M.D.   On: 04/10/2016 22:49     Medications:   . sodium chloride 100 mL/hr at 04/11/16 2235  . norepinephrine 8 mcg/min (04/12/16 0732)   . aspirin EC  81 mg Oral Daily  . atorvastatin  80 mg Oral Daily  . Chlorhexidine Gluconate Cloth  6 each Topical Q0600  . heparin  5,000 Units Subcutaneous Q8H  . Influenza vac split quadrivalent PF  0.5 mL Intramuscular Tomorrow-1000  . insulin aspart  0-5 Units Subcutaneous QHS  . insulin aspart  0-9 Units Subcutaneous TID WC  . mouth rinse   15 mL Mouth Rinse BID  . mirtazapine  30 mg Oral QHS  . mupirocin ointment  1 application Nasal BID  . pantoprazole  40 mg Oral Daily  . piperacillin-tazobactam (ZOSYN)  IV  3.375 g Intravenous Q12H  . pneumococcal 23 valent vaccine  0.5 mL Intramuscular Tomorrow-1000  . risperiDONE  0.25 mg Oral BID  . sodium chloride flush  10-40 mL Intracatheter Q12H   acetaminophen **OR** acetaminophen, ALPRAZolam, bisacodyl, HYDROmorphone (DILAUDID) injection, ondansetron **OR** ondansetron (ZOFRAN) IV, oxyCODONE, polyethylene glycol, sodium chloride flush  Assessment/ Plan:  Dennis Haynes is a 75 y.o. 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 Acute renal failure with metabolic acidosis: secondary to ATN from dehydration, hypotension and underlying illness Anuric. Patient is a poor prognosis. Not a candidate for long term dialysis.  Palliative care following.   2. Sepsis: with hypotension. Concern for pneumonia. discititis with e. faecelis  - pip/tazo - norepinephrine  LOS: [place], [place] 11/3/201710:48 AM

## 2016-04-12 NOTE — Progress Notes (Signed)
Spoke with NP on floor, notified of the decreased urine output overnight. Also notified of the creatinine and BUN continuing to elevate. No new orders received, will continue to assess.

## 2016-04-12 NOTE — Progress Notes (Addendum)
Pharmacy Antibiotic Note/Constipation Prevention  Dennis Haynes is a 75 y.o. male with a h/o DM and HTN  admitted on 04/10/2016 with hypotension and weakness. Patient on Augmentin for discitis PTA admitted with septic shock and RML PNA. Pharmacy has been consulted for Zosyn dosing and constipation prevention.  Plan: 1. After discussion with Dr. Mortimer Fries d/c Zosyn with plan for possible palliative care.  Zosyn resumed by hospitalist.   Zosyn 3.375g IV q12h (4 hour infusion).  2. Will begin senna-docusate bid.   Height: 5\' 10"  (177.8 cm) Weight: 190 lb (86.2 kg) IBW/kg (Calculated) : 73  Temp (24hrs), Avg:98.7 F (37.1 C), Min:98.6 F (37 C), Max:98.7 F (37.1 C)   Recent Labs Lab 04/10/16 1855 04/11/16 0924 04/11/16 1235 04/12/16 0446 04/12/16 0934  WBC 8.5 8.6  --  6.0  --   CREATININE 3.84* 3.95*  --  5.03*  --   LATICACIDVEN  --  2.9* 2.6*  --  2.6*    Estimated Creatinine Clearance: 12.9 mL/min (by C-G formula based on SCr of 5.03 mg/dL (H)).    No Known Allergies  Antimicrobials this admission: Augmentin 11/2 >> x 1 and PTA for discitis Zosyn 11/2 >>  Dose adjustments this admission:   Microbiology results: 11/2 MRSA PCR: positive  Thank you for allowing pharmacy to be a part of this patient's care.  Ulice Dash D 04/12/2016 12:55 PM

## 2016-04-12 NOTE — Care Management (Addendum)
Contacted by unit to discuss hospice services.  Care manager spoke with son Dennis Haynes.  It is the family's wish for patient to discharge home with hospice services.  Patient is currently followed by [facility] and Dennis Haynes would like for patient to be followed by [facility]. Contacted agency and spoke with Randall Hiss.  Dennis Haynes wishes for his dad to discharge home 11/5.  There is a hospital bed in the home, BSC, W/C.  There will be a need for home 02 for comfort.  When patient gets agitated, he getss short of breath and has required some 02 during this stay.  He will transport home by ems.  Non emergent transfer form is completed and with the paper chart.  DNR is in the paper chart but has not been signed,  Left note for attending to sign and inclusion of oxygen in the orders.  CM will need to fax the orders to agency and update Randall Hiss with [facility] [phone]. updated Dennis Haynes.  Dennis Haynes verbalized concern as to the location of patient's home meds that were transported to the hospital with him.  Referred this question to patient's nurse Merleen Nicely.  She will investigate and update Allen.

## 2016-04-12 NOTE — Progress Notes (Signed)
Notified MD Mody that MD Kasa had discontinued antibiotics during rounds this AM. Mody stated to d/c antibiotics she had reordered. See new orders, will continue to monitor patient.

## 2016-04-12 NOTE — Consult Note (Signed)
[facility] Nurse wound consult note Reason for Consult: Stage 1 right upper buttock, offloading now Moisture Associated Skin damage to groin and buttocks Deep tissue injury to left and right lateral malleolus  Wound type:pressure and moisture Pressure Ulcer POA: Yes Measurement:Left ankle:  0.5 cm diameter Right ankle:  0.3 cm diameter Erythema and denuded skin to buttocks, moisture barrier cream to area Wound [id number] and moist Drainage (amount, consistency, odor) none Periwound:intact Dressing procedure/placement/frequency:Cleanse bilateral ankles with NS and pat gently dry.  Apply silicone border foam dressing.  Change every three days and PRN soilage.  Keep perineal skin clean and dry.  Apply moisture barrier cream twice daily and PRN soilage.   Will not follow at this time.  Please re-consult if needed.  Domenic Moras RN BSN [place] Pager [phone]

## 2016-04-12 NOTE — Progress Notes (Signed)
Pts son requested to speak to me to discuss plan of care.  He states he would like to ensure his father is comfortable and pain free.  However, he would like the levophed drip to continue for now due to additional family members coming to see his father on 11/4.  He would like all other treatment discontinued.  He is hoping his father can go home with hospice hopefully 11/4 or early 11/5.  Discussed with pts son I will increase pain medicine dosage if this does not control pain will start low dose morphine drip.   Marda Stalker, [place] Pager [phone] (please enter 7 digits) PCCM Consult Pager [phone] (please enter 7 digits)

## 2016-04-12 NOTE — Progress Notes (Addendum)
[facility] NAME: Dennis Haynes    MR#:  [id number]  DATE OF BIRTH:  1941-05-25  SUBJECTIVE:   Patient seems more confused today. Creatinine is increasing. Patient is still on levophed  REVIEW OF SYSTEMS:    Review of Systems  Unable to perform ROS: Dementia    Tolerating Diet: yes      DRUG ALLERGIES:  No Known Allergies  VITALS:  Blood pressure (!) 97/54, pulse 87, temperature 98.6 F (37 C), temperature source Axillary, resp. rate 16, height 5\' 10"  (1.778 m), weight 86.2 kg (190 lb), SpO2 94 %.  PHYSICAL EXAMINATION:   Physical Exam  Constitutional: He is well-developed, well-nourished, and in no distress. No distress.  HENT:  Head: Normocephalic.  Eyes: No scleral icterus.  Neck: Normal range of motion. Neck supple. No JVD present. No tracheal deviation present.  Cardiovascular: Normal rate and regular rhythm.  Exam reveals no gallop and no friction rub.   Murmur heard. Pulmonary/Chest: Effort normal and breath sounds normal. No respiratory distress. He has no wheezes. He has no rales. He exhibits no tenderness.  Abdominal: Soft. Bowel sounds are normal. He exhibits no distension and no mass. There is no tenderness. There is no rebound and no guarding.  Musculoskeletal: Normal range of motion. He exhibits no edema.  Neurological: He is alert.  Skin: Skin is warm. No rash noted. No erythema.      LABORATORY PANEL:   CBC  Recent Labs Lab 04/12/16 0446  WBC 6.0  HGB 10.2*  HCT 30.9*  PLT 257   ------------------------------------------------------------------------------------------------------------------  Chemistries   Recent Labs Lab 04/10/16 1855 04/11/16 0924 04/12/16 0446  NA 139 140 138  K 4.2 4.5 4.9  CL 99* 106 104  CO2 23 17* 17*  GLUCOSE 47* 107* 194*  BUN 61* 61* 64*  CREATININE 3.84* 3.95* 5.03*  CALCIUM 8.6* 8.0* 7.6*  MG  --  1.5*  --   AST 51*  --   --   ALT 17  --   --    ALKPHOS 121  --   --   BILITOT <0.1*  --   --    ------------------------------------------------------------------------------------------------------------------  Cardiac Enzymes No results for input(s): TROPONINI in the last 168 hours. ------------------------------------------------------------------------------------------------------------------  RADIOLOGY:  US Renal  Result Date: 04/11/2016 CLINICAL DATA:  Acute kidney injury with sepsis and hypotension. EXAM: RENAL / URINARY TRACT ULTRASOUND COMPLETE COMPARISON:  CT 02/27/2016 FINDINGS: Right Kidney: Length: 11.0 cm. Mild increase in renal echogenicity. Parenchymal thickness is preserved. No hydronephrosis. Midpole cyst is 1.5 x 1.5 x 1.4 cm. Left Kidney: Length: 10.7 cm. Mildly echogenic renal parenchyma. Parenchymal thickness is preserved. Lower pole cyst is 1.9 x 2.3 x 2.2 cm. A second midpole cyst is 2.3 x 2.3 x 2.6 cm. No solid mass or hydronephrosis. Bladder: Bladder is decompressed by a Foley catheter. IMPRESSION: 1. Mildly echogenic kidneys. 2. Normal renal parenchymal thickness. 3. Bilateral renal cysts.  No hydronephrosis. Electronically Signed   By: Nolon Nations M.D.   On: 04/11/2016 15:13   Dg Chest Port 1 View  Result Date: 04/11/2016 CLINICAL DATA:  Central line placement. EXAM: PORTABLE CHEST 1 VIEW COMPARISON:  04/10/2016. FINDINGS: Right jugular catheter tip at the superior cavoatrial junction. No pneumothorax. Interval borderline enlargement of the cardiac silhouette, accentuated by a poor inspiration and the portable AP technique. Aortic calcifications are noted. There is a mild amount of patchy opacity at the left lung base. The right lung  is clear. The interstitial markings remain mildly prominent. Thoracic spine degenerative changes. IMPRESSION: 1. Right jugular catheter tip at the superior cavoatrial junction. No pneumothorax. 2. Mild left basilar atelectasis or pneumonia. 3. Borderline cardiomegaly. 4. Aortic  atherosclerosis. Electronically Signed   By: Claudie Revering M.D.   On: 04/11/2016 19:05   Dg Chest Port 1 View  Result Date: 04/10/2016 CLINICAL DATA:  Chronic generalized weakness. Cough and hypotension. Initial encounter. EXAM: PORTABLE CHEST 1 VIEW COMPARISON:  Chest radiograph performed 02/27/2016 FINDINGS: The lungs are well-aerated. Mild vascular congestion is noted. Minimal right midlung opacity could reflect pneumonia. There is no evidence of pleural effusion or pneumothorax. The cardiomediastinal silhouette is within normal limits. No acute osseous abnormalities are seen. IMPRESSION: Minimal right midlung opacity could reflect pneumonia. Mild vascular congestion seen. Electronically Signed   By: Garald Balding M.D.   On: 04/10/2016 22:49     ASSESSMENT AND PLAN:   75 y/o male with diabetes and hypertension who presents with weakness and found to have hypotension and acute kidney injury.  1. Acute kidney injury with anuria: This is due to ATN from dehydration, hypotension and septic shock. Patient overall prognosis is very poor. Patient is not a candidate for hemodialysis. Patient has been evaluated by palliative care and is appropriate for hospice  2. Septic shock with right middle lung lobe pneumonia: Continue levothyroid to keep map greater than 65 Blood cultures thus far are negative. ADD ZOSYN for sepsis.  3. Acute metabolic encephalopathy in the setting of sepsis and acute kidney injury with history of dementia: Continue treatment as above  4. Diabetes: Continue sliding scale insulin.  5. History of discitis (E. Faecelis) Continue Augmentin for 1 month from 10/24  6. Hyperlipidemia: Continue atorvastatin   7. Stage I pressure ulcer, present on admission and other various wounds:Cleanse bilateral ankles with NS and pat gently dry.  Apply silicone border foam dressing.  Change every three days and PRN soilage.  Keep perineal skin clean and dry.  Apply moisture barrier  cream twice daily and PRN soilage.     Discussed with patient's son yesterday. Left message today  CRITICAL CARE CODE STATUS: DO NOT RESUSCITATE  TOTAL TIME TAKING CARE OF THIS PATIENT: 30 minutes.   Patient with increasing creatinine and need for pressors for blood pressure  POSSIBLE D/C ?? With hospice, DEPENDING ON CLINICAL CONDITION.   Makyia Erxleben M.D on 04/12/2016 at 11:52 AM  Between 7am to 6pm - Pager - [phone] After 6pm go to [email or web address] - password [email or web address] [facility]  Office  [phone]  CC: Primary care physician; Marygrace Drought, MD  Note: This dictation was prepared with Dragon dictation along with smaller phrase technology. Any transcriptional errors that result from this process are unintentional.

## 2016-04-12 NOTE — Progress Notes (Signed)
PULMONARY / CRITICAL CARE MEDICINE   Name: Dennis Haynes MRN: [id number] DOB: [birth year]    ADMISSION DATE:  04/10/2016 CONSULTATION DATE:  04/11/16  REFERRING MD: Dr. Lance Coon  CHIEF COMPLAINT:  Hypotension  Patient Profile Dennis Haynes is a 75 yo male with medical history significant for chronic back pain, Dementia, Diabetes and hypertension. Patient presents to the ED on 11/1 with confusion,lethargy and low BP.Patient was recently discharged from rehabilitation  Facility. Patient has not been active since then.  Patient was treated for discitis and is still on oral antibiotics for the same.Patient was very lethargic and when son checked his BP,he found it to be low and brought him to ED.He initially responded well to crystalloids.  PCCM was consulted for further  recommendation.  DISCUSSION: 75 yo male admitted with hypotension and weakness requiring  Pressors.   SUBJECTIVE:  Patient remains confused, responds to name but unable to participate in any conversation.Patient continues to have worsening renal function and urine output of just 35 ml/24 hours.   VITAL SIGNS: BP (!) 112/50   Pulse 86   Temp 98.6 F (37 C) (Axillary)   Resp 15   Ht 5\' 10"  (1.778 m)   Wt 86.2 kg (190 lb)   SpO2 92%   BMI 27.26 kg/m   HEMODYNAMICS:    VENTILATOR SETTINGS:    INTAKE / OUTPUT: I/O last 3 completed shifts: In: 2351.5 [P.O.:120; I.V.:1181.5; IV Piggyback:1050] Out: 1320 [Urine:1320]  PHYSICAL EXAMINATION: General:  Sickly appearing , elderly gentleman Neuro:  confused HEENT:  Atraumatic, Normocephalic, no discharge, no JVD appreciated Cardiovascular: S1S2,RRR, NO mrg noted Lungs: clear bilaterally, no wheezes, crackles, rhonchi noted Abdomen :soft, nontender, active bowel sounds Musculoskeletal:  No inflammation/ deformity noted Skin:  Grossly intact  LABS:  BMET  Recent Labs Lab 04/10/16 1855 04/11/16 0924 04/12/16 0446  NA 139 140 138  K 4.2 4.5 4.9  CL 99* 106 104   CO2 23 17* 17*  BUN 61* 61* 64*  CREATININE 3.84* 3.95* 5.03*  GLUCOSE 47* 107* 194*    Electrolytes  Recent Labs Lab 04/10/16 1855 04/11/16 0924 04/12/16 0446  CALCIUM 8.6* 8.0* 7.6*  MG  --  1.5*  --     CBC  Recent Labs Lab 04/10/16 1855 04/11/16 0924 04/12/16 0446  WBC 8.5 8.6 6.0  HGB 11.6* 11.6* 10.2*  HCT 35.6* 35.8* 30.9*  PLT 267 260 257    Coag's No results for input(s): APTT, INR in the last 168 hours.  Sepsis Markers  Recent Labs Lab 04/11/16 0924 04/11/16 1235  LATICACIDVEN 2.9* 2.6*    ABG No results for input(s): PHART, PCO2ART, PO2ART in the last 168 hours.  Liver Enzymes  Recent Labs Lab 04/10/16 1855  AST 51*  ALT 17  ALKPHOS 121  BILITOT <0.1*  ALBUMIN 2.2*    Cardiac Enzymes No results for input(s): TROPONINI, PROBNP in the last 168 hours.  Glucose  Recent Labs Lab 04/11/16 0029 04/11/16 0052 04/11/16 0720 04/11/16 1124 04/11/16 1634 04/11/16 2159  GLUCAP 53* 97 96 117* 167* 176*    Imaging US Renal  Result Date: 04/11/2016 CLINICAL DATA:  Acute kidney injury with sepsis and hypotension. EXAM: RENAL / URINARY TRACT ULTRASOUND COMPLETE COMPARISON:  CT 02/27/2016 FINDINGS: Right Kidney: Length: 11.0 cm. Mild increase in renal echogenicity. Parenchymal thickness is preserved. No hydronephrosis. Midpole cyst is 1.5 x 1.5 x 1.4 cm. Left Kidney: Length: 10.7 cm. Mildly echogenic renal parenchyma. Parenchymal thickness is preserved. Lower pole cyst is 1.9 x  2.3 x 2.2 cm. A second midpole cyst is 2.3 x 2.3 x 2.6 cm. No solid mass or hydronephrosis. Bladder: Bladder is decompressed by a Foley catheter. IMPRESSION: 1. Mildly echogenic kidneys. 2. Normal renal parenchymal thickness. 3. Bilateral renal cysts.  No hydronephrosis. Electronically Signed   By: Nolon Nations M.D.   On: 04/11/2016 15:13   Dg Chest Port 1 View  Result Date: 04/11/2016 CLINICAL DATA:  Central line placement. EXAM: PORTABLE CHEST 1 VIEW COMPARISON:   04/10/2016. FINDINGS: Right jugular catheter tip at the superior cavoatrial junction. No pneumothorax. Interval borderline enlargement of the cardiac silhouette, accentuated by a poor inspiration and the portable AP technique. Aortic calcifications are noted. There is a mild amount of patchy opacity at the left lung base. The right lung is clear. The interstitial markings remain mildly prominent. Thoracic spine degenerative changes. IMPRESSION: 1. Right jugular catheter tip at the superior cavoatrial junction. No pneumothorax. 2. Mild left basilar atelectasis or pneumonia. 3. Borderline cardiomegaly. 4. Aortic atherosclerosis. Electronically Signed   By: Claudie Revering M.D.   On: 04/11/2016 19:05     STUDIES:  none  CULTURES: none  ANTIBIOTICS: 04/11/16 Augmentin>>  SIGNIFICANT EVENTS:  11/2>>Patient admitted with hypotension on very low dose levophed.  On RA  LINES/TUBES: NONE   ASSESSMENT / PLAN:  PULMONARY A: ?HCAP P:   Continue Zosyn  CARDIOVASCULAR A:  Hypotension Shock Hx of hypertension P:  Continuous Telemetry Levo gtt Keep MAP goals>65 Hold metoprolol/hydrochlorothiazide Continue Aspirin Continue lipitor  RENAL A:   Acute kidney injury- worsening renal function Bladder mass- incidental finding on CT P:   Hydrate the patient Strict I/o Follow chemistry Replace electrolytes per ICU protocol Nephrology consulted Family does not want aggressive care such as dialysis. Palliative care consulted    GASTROINTESTINAL A:   Hx of GERD P:   Continue Protonix  HEMATOLOGIC A:   No active issues P:  Heparin for DVT prophylaxis  INFECTIOUS A:   Discitis Sepsis - highly unlikely  P:   Continue Augmentin Monitor fever curve  ENDOCRINE A:   DM P:   BS checks ACHS SSI Coverage   NEUROLOGIC A:   Altered Mental status Hx of anxiety Hx of Dementia P:   Minimize sedating medications Reorient freequently Rest per primary     [name],AG-ACNP Pulmonary and [place]   04/12/2016, 6:04 AM

## 2016-04-13 DIAGNOSIS — F419 Anxiety disorder, unspecified: Secondary | ICD-10-CM

## 2016-04-13 DIAGNOSIS — R570 Cardiogenic shock: Secondary | ICD-10-CM

## 2016-04-13 LAB — BASIC METABOLIC PANEL
ANION GAP: 11 (ref 5–15)
BUN: 71 mg/dL — ABNORMAL HIGH (ref 6–20)
CALCIUM: 8 mg/dL — AB (ref 8.9–10.3)
CO2: 20 mmol/L — AB (ref 22–32)
Chloride: 106 mmol/L (ref 101–111)
Creatinine, Ser: 5.56 mg/dL — ABNORMAL HIGH (ref 0.61–1.24)
GFR, EST AFRICAN AMERICAN: 10 mL/min — AB (ref >60)
GFR, EST NON AFRICAN AMERICAN: 9 mL/min — AB (ref >60)
Glucose, Bld: 177 mg/dL — ABNORMAL HIGH (ref 65–99)
Potassium: 4.2 mmol/L (ref 3.5–5.1)
SODIUM: 137 mmol/L (ref 135–145)

## 2016-04-13 LAB — GLUCOSE, CAPILLARY
GLUCOSE-CAPILLARY: 152 mg/dL — AB (ref 65–99)
GLUCOSE-CAPILLARY: 158 mg/dL — AB (ref 65–99)
GLUCOSE-CAPILLARY: 176 mg/dL — AB (ref 65–99)
Glucose-Capillary: 125 mg/dL — ABNORMAL HIGH (ref 65–99)

## 2016-04-13 NOTE — Progress Notes (Signed)
PULMONARY / CRITICAL CARE MEDICINE   Name: Dennis Haynes MRN: [id number] DOB: 1940/11/10    ADMISSION DATE:  04/10/2016 CONSULTATION DATE:  04/11/16  REFERRING MD: Dr. Lance Coon  CHIEF COMPLAINT:  Hypotension  Patient Profile Dennis Haynes is a 75 yo male with medical history significant for chronic back pain, Dementia, Diabetes and hypertension. Patient presents to the ED on 11/1 with confusion,lethargy and low BP.Patient was recently discharged from rehabilitation  Facility. Patient has not been active since then.  Patient was treated for discitis and is still on oral antibiotics for the same.Patient was very lethargic and when son checked his BP,he found it to be low and brought him to ED.He initially responded well to crystalloids.  PCCM was consulted for further  recommendation.  DISCUSSION: 75 yo male admitted with hypotension and weakness requiring  Pressors.   SUBJECTIVE:  Patient remains confused, responds to name but unable to participate in any conversation.Patient continues to have worsening renal function  Poor prognosis, patient is DNR  VITAL SIGNS: BP (!) 100/59   Pulse 96   Temp 98.4 F (36.9 C)   Resp 13   Ht 5\' 10"  (1.778 m)   Wt 190 lb (86.2 kg)   SpO2 98%   BMI 27.26 kg/m   HEMODYNAMICS:    VENTILATOR SETTINGS:    INTAKE / OUTPUT: I/O last 3 completed shifts: In: 2093.1 [I.V.:2043.1; IV Piggyback:50] Out: 465 [Urine:465]  PHYSICAL EXAMINATION: General:  Sickly appearing , elderly gentleman Neuro:  confused HEENT:  Atraumatic, Normocephalic, no discharge, no JVD appreciated Cardiovascular: S1S2,RRR, NO mrg noted Lungs: clear bilaterally, no wheezes, crackles, rhonchi noted Abdomen :soft, nontender, active bowel sounds Musculoskeletal:  No inflammation/ deformity noted Skin:  Grossly intact  LABS:  BMET  Recent Labs Lab 04/11/16 0924 04/12/16 0446 04/13/16 0454  NA 140 138 137  K 4.5 4.9 4.2  CL 106 104 106  CO2 17* 17* 20*  BUN 61* 64*  71*  CREATININE 3.95* 5.03* 5.56*  GLUCOSE 107* 194* 177*    Electrolytes  Recent Labs Lab 04/11/16 0924 04/12/16 0446 04/13/16 0454  CALCIUM 8.0* 7.6* 8.0*  MG 1.5*  --   --     CBC  Recent Labs Lab 04/10/16 1855 04/11/16 0924 04/12/16 0446  WBC 8.5 8.6 6.0  HGB 11.6* 11.6* 10.2*  HCT 35.6* 35.8* 30.9*  PLT 267 260 257    Coag's No results for input(s): APTT, INR in the last 168 hours.  Sepsis Markers  Recent Labs Lab 04/11/16 0924 04/11/16 1235 04/12/16 0934  LATICACIDVEN 2.9* 2.6* 2.6*    ABG No results for input(s): PHART, PCO2ART, PO2ART in the last 168 hours.  Liver Enzymes  Recent Labs Lab 04/10/16 1855  AST 51*  ALT 17  ALKPHOS 121  BILITOT <0.1*  ALBUMIN 2.2*    Cardiac Enzymes No results for input(s): TROPONINI, PROBNP in the last 168 hours.  Glucose  Recent Labs Lab 04/11/16 2159 04/12/16 0749 04/12/16 1140 04/12/16 1632 04/12/16 1938 04/13/16 0727  GLUCAP 176* 199* 157* 163* 163* 152*    Imaging No results found.   STUDIES:  none  CULTURES: none  ANTIBIOTICS: 04/11/16 Augmentin>>  SIGNIFICANT EVENTS:  11/2>>Patient admitted with hypotension on very low dose levophed.  On RA  LINES/TUBES: NONE   ASSESSMENT / PLAN:  PULMONARY A: ?HCAP P:   Continue Zosyn  CARDIOVASCULAR A:  Hypotension Shock Hx of hypertension P:  Continuous Telemetry Levo gtt Keep MAP goals>65 Hold metoprolol/hydrochlorothiazide Continue Aspirin Continue lipitor  RENAL  A:   Acute kidney injury- worsening renal function Bladder mass- incidental finding on CT P:   Hydrate the patient Strict I/o Follow chemistry Replace electrolytes per ICU protocol Nephrology consulted Family does not want aggressive care such as dialysis. Palliative care consulted    GASTROINTESTINAL A:   Hx of GERD P:   Continue Protonix  HEMATOLOGIC A:   No active issues P:  Heparin for DVT prophylaxis  INFECTIOUS A:    Discitis Sepsis - highly unlikely  P:   Continue Augmentin Monitor fever curve  ENDOCRINE A:   DM P:   BS checks ACHS SSI Coverage   NEUROLOGIC A:   Altered Mental status Hx of anxiety Hx of Dementia P:   Minimize sedating medications Reorient freequently Rest per primary  I have personally obtained a history, examined the patient, evaluated Pertinent laboratory and RadioGraphic/imaging results, and  formulated the assessment and plan   The Patient requires high complexity decision making for assessment and support, frequent evaluation and titration of therapies, application of advanced monitoring technologies and extensive interpretation of multiple databases. Critical Care Time devoted to patient care services described in this note is 35 minutes.   Overall, patient is critically ill, prognosis is guarded.  Patient with Multiorgan failure and at high risk for cardiac arrest and death.  Will proceed with comfort care measures and transfer to hospice if able  Corrin Parker, M.D.  [facility]  Medical Director [place] Director [facility] Cardio-Pulmonary Department

## 2016-04-13 NOTE — Progress Notes (Signed)
Pharmacy Antibiotic Note/Constipation Prevention  Dennis Haynes is a 75 y.o. male with a h/o DM and HTN  admitted on 04/10/2016 with hypotension and weakness. Patient on Augmentin for discitis PTA admitted with septic shock and RML PNA. Pharmacy has been consulted for Zosyn dosing and constipation prevention.  Plan: 1. After discussion with Dr. Mortimer Fries d/c Zosyn with plan for possible palliative care.   2. Will begin senna-docusate bid.   Height: 5\' 10"  (177.8 cm) Weight: 190 lb (86.2 kg) IBW/kg (Calculated) : 73  Temp (24hrs), Avg:98 F (36.7 C), Min:97.7 F (36.5 C), Max:98.4 F (36.9 C)   Recent Labs Lab 04/10/16 1855 04/11/16 0924 04/11/16 1235 04/12/16 0446 04/12/16 0934 04/13/16 0454  WBC 8.5 8.6  --  6.0  --   --   CREATININE 3.84* 3.95*  --  5.03*  --  5.56*  LATICACIDVEN  --  2.9* 2.6*  --  2.6*  --     Estimated Creatinine Clearance: 11.7 mL/min (by C-G formula based on SCr of 5.56 mg/dL (H)).    No Known Allergies  Antimicrobials this admission: Augmentin 11/2 >> x 1 and PTA for discitis Zosyn 11/2 >>11/3  Dose adjustments this admission:   Microbiology results: 11/2 MRSA PCR: positive  Thank you for allowing pharmacy to be a part of this patient's care.  Olivia Canter, [facility] 04/13/2016 11:51 AM

## 2016-04-13 NOTE — Progress Notes (Signed)
Pt has been asleep comfortably most of the night. Woke x 2 and medicated with Dilaudid for pt moaning out loud. Otherwise, pt has been quiet. Continue on Levo at 5 mcg. Mouth moistened. Had to use 2L oxygen per nasal cannula when sats dropped to 80s this AM. Usually drops when pt receives dilaudid but rebounds to high 90s. Has been staying in high 80s to low 90s on RA. Son was in at [facility], stated family was flying in Saturday afternoon and they would be in after that. Plan for comfort care and transfer to hospice on Sunday.

## 2016-04-13 NOTE — Progress Notes (Signed)
Family Meeting Note  Advance Directive:yes  Today a meeting took place with the Maurine Cane, son.  Patient is unable to participate due [id number] capacity confusion/dementia   The following clinical team members were present during this meeting:MD  The following were discussed:Patient's diagnosis AKI or sitting: , Patient's progosis: < 6 weeks and Goals for treatment: DNR and hospice  Additional follow-up to be provided: Family would like patient to go home on Sunday with hospice care. Hospice is being arranged. Discussed about EMS transportation and medications at discharge for comfort  Time spent during discussion 16 minutes Mollye Guinta, MD

## 2016-04-13 NOTE — Progress Notes (Signed)
[place] [facility] [place] NAME: Dennis Haynes    MR#:  [id number]  DATE OF BIRTH:  1940-11-21  SUBJECTIVE:  C/o back pain   REVIEW OF SYSTEMS:    Review of Systems  Constitutional: Positive for malaise/fatigue. Negative for chills and fever.  HENT: Negative.  Negative for ear discharge, ear pain, hearing loss, nosebleeds and sore throat.   Eyes: Negative.  Negative for blurred vision and pain.  Respiratory: Negative.  Negative for cough, hemoptysis, shortness of breath and wheezing.   Cardiovascular: Negative.  Negative for chest pain, palpitations and leg swelling.  Gastrointestinal: Positive for abdominal pain. Negative for blood in stool, diarrhea, nausea and vomiting.  Genitourinary: Negative.  Negative for dysuria.  Musculoskeletal: Positive for back pain.  Skin: Negative.   Neurological: Positive for weakness. Negative for dizziness, tremors, speech change, focal weakness, seizures and headaches.  Endo/Heme/Allergies: Negative.  Does not bruise/bleed easily.  Psychiatric/Behavioral: Positive for memory loss. Negative for depression, hallucinations and suicidal ideas.    Tolerating Diet: yes      DRUG ALLERGIES:  No Known Allergies  VITALS:  Blood pressure (!) 100/59, pulse 96, temperature 98.4 F (36.9 C), resp. rate 13, height 5\' 10"  (1.778 m), weight 86.2 kg (190 lb), SpO2 98 %.  PHYSICAL EXAMINATION:   Physical Exam  Constitutional: No distress.  Chronically ill-appearing  HENT:  Head: Normocephalic.  Eyes: No scleral icterus.  Neck: Normal range of motion. Neck supple. No JVD present. No tracheal deviation present.  Cardiovascular: Normal rate, regular rhythm and normal heart sounds.  Exam reveals no gallop and no friction rub.   No murmur heard. Pulmonary/Chest: Effort normal and breath sounds normal. No respiratory distress. He has no wheezes. He has no rales. He exhibits no tenderness.  Abdominal: Soft. Bowel sounds are  normal. He exhibits no distension and no mass. There is no tenderness. There is no rebound and no guarding.  Musculoskeletal: He exhibits no edema.  Wiggles his toes but has 2 out of 5 bilateral lower extreme strength  Neurological: He is alert.  Skin: Skin is warm. No rash noted. He is not diaphoretic. No erythema.  Multiple abrasions and pressure also      LABORATORY PANEL:   CBC  Recent Labs Lab 04/12/16 0446  WBC 6.0  HGB 10.2*  HCT 30.9*  PLT 257   ------------------------------------------------------------------------------------------------------------------  Chemistries   Recent Labs Lab 04/10/16 1855 04/11/16 0924  04/13/16 0454  NA 139 140  < > 137  K 4.2 4.5  < > 4.2  CL 99* 106  < > 106  CO2 23 17*  < > 20*  GLUCOSE 47* 107*  < > 177*  BUN 61* 61*  < > 71*  CREATININE 3.84* 3.95*  < > 5.56*  CALCIUM 8.6* 8.0*  < > 8.0*  MG  --  1.5*  --   --   AST 51*  --   --   --   ALT 17  --   --   --   ALKPHOS 121  --   --   --   BILITOT <0.1*  --   --   --   < > = values in this interval not displayed. ------------------------------------------------------------------------------------------------------------------  Cardiac Enzymes No results for input(s): TROPONINI in the last 168 hours. ------------------------------------------------------------------------------------------------------------------  RADIOLOGY:  US Renal  Result Date: 04/11/2016 CLINICAL DATA:  Acute kidney injury with sepsis and hypotension. EXAM: RENAL / URINARY TRACT ULTRASOUND COMPLETE COMPARISON:  CT 02/27/2016 FINDINGS: Right Kidney: Length: 11.0 cm. Mild increase in renal echogenicity. Parenchymal thickness is preserved. No hydronephrosis. Midpole cyst is 1.5 x 1.5 x 1.4 cm. Left Kidney: Length: 10.7 cm. Mildly echogenic renal parenchyma. Parenchymal thickness is preserved. Lower pole cyst is 1.9 x 2.3 x 2.2 cm. A second midpole cyst is 2.3 x 2.3 x 2.6 cm. No solid mass or  hydronephrosis. Bladder: Bladder is decompressed by a Foley catheter. IMPRESSION: 1. Mildly echogenic kidneys. 2. Normal renal parenchymal thickness. 3. Bilateral renal cysts.  No hydronephrosis. Electronically Signed   By: Nolon Nations M.D.   On: 04/11/2016 15:13   Dg Chest Port 1 View  Result Date: 04/11/2016 CLINICAL DATA:  Central line placement. EXAM: PORTABLE CHEST 1 VIEW COMPARISON:  04/10/2016. FINDINGS: Right jugular catheter tip at the superior cavoatrial junction. No pneumothorax. Interval borderline enlargement of the cardiac silhouette, accentuated by a poor inspiration and the portable AP technique. Aortic calcifications are noted. There is a mild amount of patchy opacity at the left lung base. The right lung is clear. The interstitial markings remain mildly prominent. Thoracic spine degenerative changes. IMPRESSION: 1. Right jugular catheter tip at the superior cavoatrial junction. No pneumothorax. 2. Mild left basilar atelectasis or pneumonia. 3. Borderline cardiomegaly. 4. Aortic atherosclerosis. Electronically Signed   By: Claudie Revering M.D.   On: 04/11/2016 19:05     ASSESSMENT AND PLAN:    75 y/o male with diabetes and hypertension who presents with weakness and found to have hypotension and acute kidney injury.  1. Acute kidney injury with anuria: This is due to ATN from dehydration, hypotension and septic shock. Patient overall prognosis is very poor. Patient is not a candidate for hemodialysis. Patient has been evaluated by palliative care and is appropriate for hospice. Family to come together today and discuss comfort care  2. Septic shock with right middle lung lobe pneumonia: Continue levothyroid to keep map greater than 65 Blood cultures thus far are negative. Antibiotics stopped yesterday by intensivist due to overall poor prognosis and family who wanting more conservative management.   3. Acute metabolic encephalopathy in the setting of sepsis and acute  kidney injury with history of dementia:   4. Diabetes: Continue sliding scale insulin.  5. History of discitis (E. Faecelis)Continue Augmentin for 1 month from 10/24  6. Hyperlipidemia: Continue atorvastatin   Patient has a very poor prognosis and is a good candidate for hospice with increasing creatinine and not a candidate for hemodialysis  Left message was 10 CODE STATUS: DO NOT RESUSCITATE   TOTAL TIME TAKING CARE OF THIS PATIENT: 30 minutes.   D/w nurse  POSSIBLE D/C ??, DEPENDING ON CLINICAL CONDITION.   Delany Steury M.D on 04/13/2016 at 8:10 AM  Between 7am to 6pm - Pager - [phone] After 6pm go to [email or web address] - password [email or web address] [facility]  Office  [phone]  CC: Primary care physician; Marygrace Drought, MD  Note: This dictation was prepared with Dragon dictation along with smaller phrase technology. Any transcriptional errors that result from this process are unintentional.

## 2016-04-14 LAB — GLUCOSE, CAPILLARY
GLUCOSE-CAPILLARY: 149 mg/dL — AB (ref 65–99)
GLUCOSE-CAPILLARY: 208 mg/dL — AB (ref 65–99)

## 2016-04-14 MED ORDER — MORPHINE SULFATE (CONCENTRATE) 10 MG /0.5 ML PO SOLN: 5.0000 mg | ORAL | 0 refills | Status: AC | PRN

## 2016-04-14 NOTE — Plan of Care (Signed)
Problem: Education: Goal: Knowledge of [facility] Education information/materials will improve Outcome: Progressing BP 95/61   Pulse (!) 108   Temp 98.6 F (37 C) (Oral)   Resp 13   Ht 5\' 10"  (1.778 m)   Wt 86.2 kg (190 lb)   SpO2 98%   BMI 27.26 kg/m   POC reviewed with patient, cont on q2 turns, vital signs closely monitored and any concerns addressed at this time. Will continue to monitor.  dc to hospice today  Foley in place  r-it triple lumen

## 2016-04-14 NOTE — Plan of Care (Signed)
Pt will dc with foley  Son Dub Mikes has already filled the home prescription of morphine

## 2016-04-14 NOTE — Plan of Care (Signed)
Pt just got picked up by ems. CM notified for hospice follow up

## 2016-04-14 NOTE — Plan of Care (Signed)
Called 911 for transport, to take pt home with hospice, made CM aware  Prescription given to son Dub Mikes to take to pharmacy to fill it.  IJ was removed

## 2016-04-14 NOTE — Plan of Care (Signed)
Gave a copy of avs to son Dennis Haynes

## 2016-04-14 NOTE — Progress Notes (Signed)
Plan for pt today is discharge to hospice. Only required Dilaudid once this shift, earlier in evening, and then slept all night.

## 2016-04-14 NOTE — Discharge Summary (Signed)
[place] at [place] NAME: Dennis Haynes    MR#:  [id number]  [place] OF BIRTH:  1941-03-27  DATE OF ADMISSION:  04/10/2016 ADMITTING PHYSICIAN: Lance Coon, MD  DATE OF DISCHARGE: 04/14/2016  PRIMARY CARE PHYSICIAN: Marygrace Drought, MD    ADMISSION DIAGNOSIS:  Cough [R05] Acute renal failure, unspecified acute renal failure type ([place]) [N17.9]  DISCHARGE DIAGNOSIS:  Principal Problem:   AKI (acute kidney injury) ([place]) Active Problems:   Discitis of thoracic region   Essential hypertension   Type 2 diabetes mellitus with complication, with long-term current use of insulin (HCC)   GERD (gastroesophageal reflux disease)   Anxiety   Bladder mass   Pressure injury of skin   Palliative care encounter   SECONDARY DIAGNOSIS:   Past Medical History:  Diagnosis Date  . Anxiety   . Chronic back pain   . Dementia   . Diabetes ([place])   . Hypertension     HOSPITAL COURSE:   75 y/o male with diabetes and hypertension who presents with weakness and found to have hypotension and acute kidney injury.  1. Acute kidney injury with anuria: This is due to ATN from dehydration, hypotension and septic shock. Patient overall prognosis is very poor. Patient is not a candidate for hemodialysis. Patient was been evaluated by palliative care and is appropriate for hospice. Family has decided on home with HOSPICE.  2. Septic shock with right middle lung lobe pneumonia: He was on Levophed. Blood pressure has actually improved at discharge. Blood cultures were negative. Antibiotics were stopped as the patient's overall prognosis is very poor and this was decided by the intensivist and family members.   3. Acute metabolic encephalopathy in the setting of sepsis and acute kidney injury with history of dementia:   4. Diabetes:  5. History of discitis (E. Faecelis)he was started on Augmentin for 1 month from 10/24. He has already completed IV  therapy 6. Hyperlipidemia:   Patient will be discharged with Foley catheter.  DISCHARGE CONDITIONS AND DIET:   Serious condition diet as tolerated  CONSULTS OBTAINED:  Treatment Team:  Murlean Iba, MD Flora Lipps, MD  DRUG ALLERGIES:  No Known Allergies  DISCHARGE MEDICATIONS:   Current Discharge Medication List    START taking these medications   Details  Morphine Sulfate (MORPHINE CONCENTRATE) 10 mg / 0.5 ml concentrated solution Take 0.25 mLs (5 mg total) by mouth every 2 (two) hours as needed for severe pain, anxiety or shortness of breath. Qty: 30 mL, Refills: 0      CONTINUE these medications which have NOT CHANGED   Details  acetaminophen (TYLENOL) 325 MG tablet Take 2 tablets (650 mg total) by mouth every 6 (six) hours as needed for mild pain (or Fever >/= 101).    ALPRAZolam (XANAX) 0.5 MG tablet Take 1 tablet (0.5 mg total) by mouth 3 (three) times daily as needed for anxiety. Qty: 30 tablet, Refills: 0    bisacodyl (DULCOLAX) 10 MG suppository Place 1 suppository (10 mg total) rectally daily as needed for moderate constipation (now). Qty: 12 suppository, Refills: 0    Oxycodone HCl 10 MG TABS Take 1 tablet by mouth every 6 (six) hours as needed.    polyethylene glycol (MIRALAX / GLYCOLAX) packet Take 17 g by mouth 2 (two) times daily. Qty: 14 each, Refills: 0    risperiDONE (RISPERDAL) 0.25 MG tablet Take 1 tablet (0.25 mg total) by mouth 2 (two) times daily. Qty: 60 tablet, Refills:  0      STOP taking these medications     aspirin EC 81 MG tablet      atorvastatin (LIPITOR) 80 MG tablet      hydrochlorothiazide (HYDRODIURIL) 25 MG tablet      Insulin Detemir (LEVEMIR FLEXPEN) 100 UNIT/ML Pen      metFORMIN (GLUCOPHAGE) 500 MG tablet      metoprolol (LOPRESSOR) 50 MG tablet      mirtazapine (REMERON) 30 MG tablet      NOVOLOG FLEXPEN 100 UNIT/ML FlexPen      omeprazole (PRILOSEC) 40 MG capsule      polyethylene glycol powder  (GLYCOLAX/MIRALAX) powder      pregabalin (LYRICA) 150 MG capsule      RA VITAMIN B-12 TR 1000 MCG TBCR      senna (SENOKOT) 8.6 MG TABS tablet      sertraline (ZOLOFT) 100 MG tablet      traMADol (ULTRAM) 50 MG tablet      vitamin B-12 1000 MCG tablet      zolpidem (AMBIEN) 5 MG tablet               Today   CHIEF COMPLAINT:  Patient complaining of back pain. Seems confused this morning   VITAL SIGNS:  Blood pressure 102/61, pulse (!) 122, temperature 98.6 F (37 C), temperature source Oral, resp. rate 13, height 5\' 10"  (1.778 m), weight 86.2 kg (190 lb), SpO2 98 %.   REVIEW OF SYSTEMS:  Review of Systems  Unable to perform ROS: Dementia     PHYSICAL EXAMINATION:  GENERAL:  75 y.o.-year-old patient lying in the bed with Moderate distress.  NECK:  Supple, no jugular venous distention. No thyroid enlargement, no tenderness.  LUNGS: Decreased breath sounds bilaterally, minimal bilateral wheezing, no rales,rhonchi  No use of accessory muscles of respiration.  CARDIOVASCULAR: S1, S2 normal. 2/6 murmurs no rubs, or gallops.  ABDOMEN: Soft, non-tender, non-distended. Bowel sounds present. No organomegaly or mass.  EXTREMITIES: Minimal pedal edema, no cyanosis, or clubbing.  PSYCHIATRIC: The patient is alert and oriented x name not place or time  SKIN: Numerous deep tissue injuries to ankles and right upper buttock  DATA REVIEW:   CBC  Recent Labs Lab 04/12/16 0446  WBC 6.0  HGB 10.2*  HCT 30.9*  PLT 257    Chemistries   Recent Labs Lab 04/10/16 1855 04/11/16 0924  04/13/16 0454  NA 139 140  < > 137  K 4.2 4.5  < > 4.2  CL 99* 106  < > 106  CO2 23 17*  < > 20*  GLUCOSE 47* 107*  < > 177*  BUN 61* 61*  < > 71*  CREATININE 3.84* 3.95*  < > 5.56*  CALCIUM 8.6* 8.0*  < > 8.0*  MG  --  1.5*  --   --   AST 51*  --   --   --   ALT 17  --   --   --   ALKPHOS 121  --   --   --   BILITOT <0.1*  --   --   --   < > = values in this interval not  displayed.  Cardiac Enzymes No results for input(s): TROPONINI in the last 168 hours.  Microbiology Results  @[email or web address] @  RADIOLOGY:  No results found.    Management plans discussed with the patient's son and he is in agreement. Stable for discharge home with hospice  Patient should follow up with hospice  CODE STATUS:     Code Status Orders        Start     Ordered   04/11/16 1240  Do not attempt resuscitation (DNR)  Continuous    Question Answer Comment  In the event of cardiac or respiratory ARREST Do not call a "code blue"   In the event of cardiac or respiratory ARREST Do not perform Intubation, CPR, defibrillation or ACLS   In the event of cardiac or respiratory ARREST Use medication by any route, position, wound care, and other measures to relive pain and suffering. May use oxygen, suction and manual treatment of airway obstruction as needed for comfort.      04/11/16 1239    Code Status History    Date Active Date Inactive Code Status Order ID Comments User Context   04/11/2016 12:26 AM 04/11/2016 12:39 PM Full Code [id number]  Lance Coon, MD Inpatient   02/28/2016  1:02 AM 03/05/2016  8:01 PM Full Code [id number]  Rise Patience, MD Inpatient   02/05/2016 11:32 PM 02/08/2016  4:15 PM Full Code [id number]  Nicholes Mango, MD Inpatient    Advance Directive Documentation   Flowsheet Row Most Recent Value  Type of Advance Directive  Healthcare Power of [place], Living will  Pre-existing out of facility DNR order (yellow form or pink MOST form)  No data  "MOST" Form in Place?  No data      TOTAL TIME TAKING CARE OF THIS PATIENT: 33 minutes.    Note: This dictation was prepared with Dragon dictation along with smaller phrase technology. Any transcriptional errors that result from this process are unintentional.  Elanah Osmanovic M.D on 04/14/2016 at 11:10 AM  Between 7am to 6pm - Pager - [phone] After 6pm go to [email or web address] - [facility]  Office  [phone]  CC: Primary care physician; Marygrace Drought, MD

## 2016-06-10 DEATH — deceased

## 2018-02-12 IMAGING — CR DG CHEST 2V
2 series · 2 of 2 positions shown · non-contrast
Comparison: 03/10/2012 thoracic spine radiographs

CLINICAL DATA: Weakness

EXAM:
CHEST  2 VIEW

[chest lat]
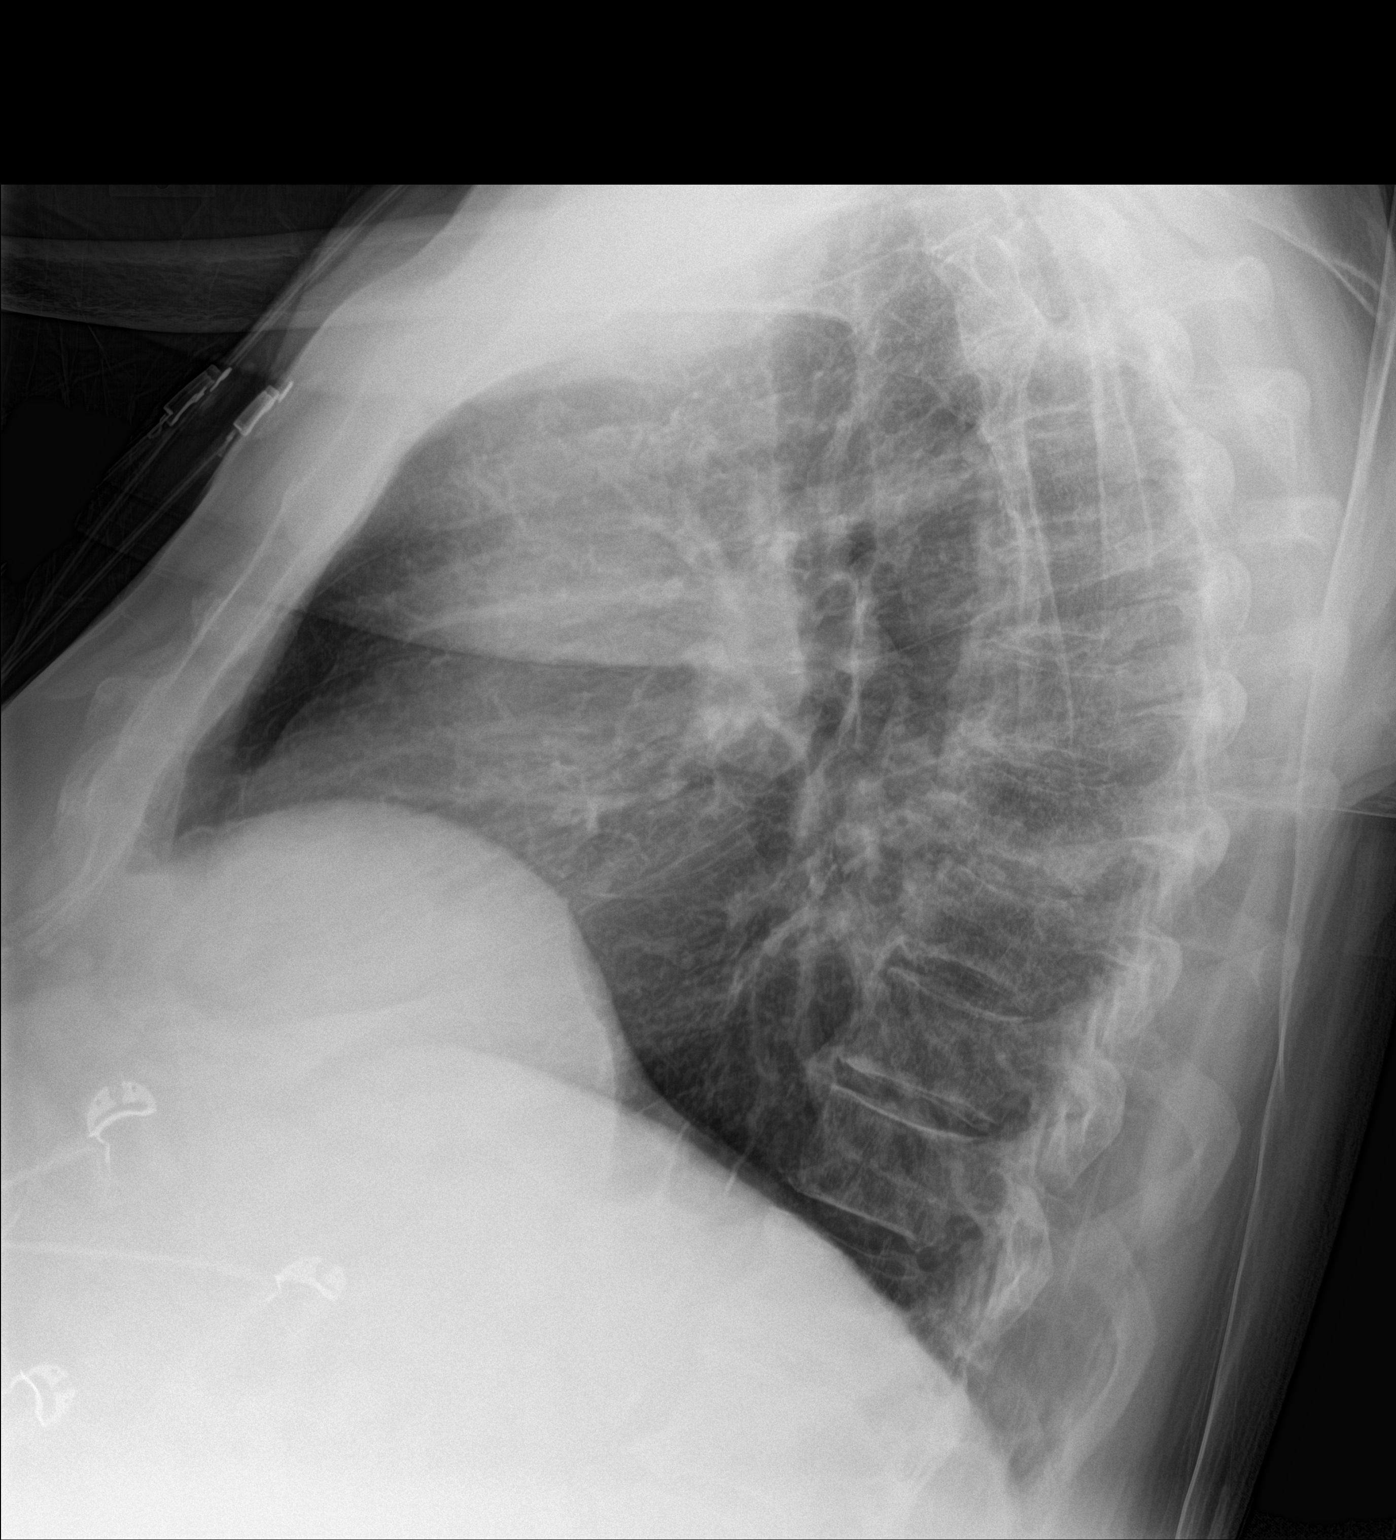

[chest ap]
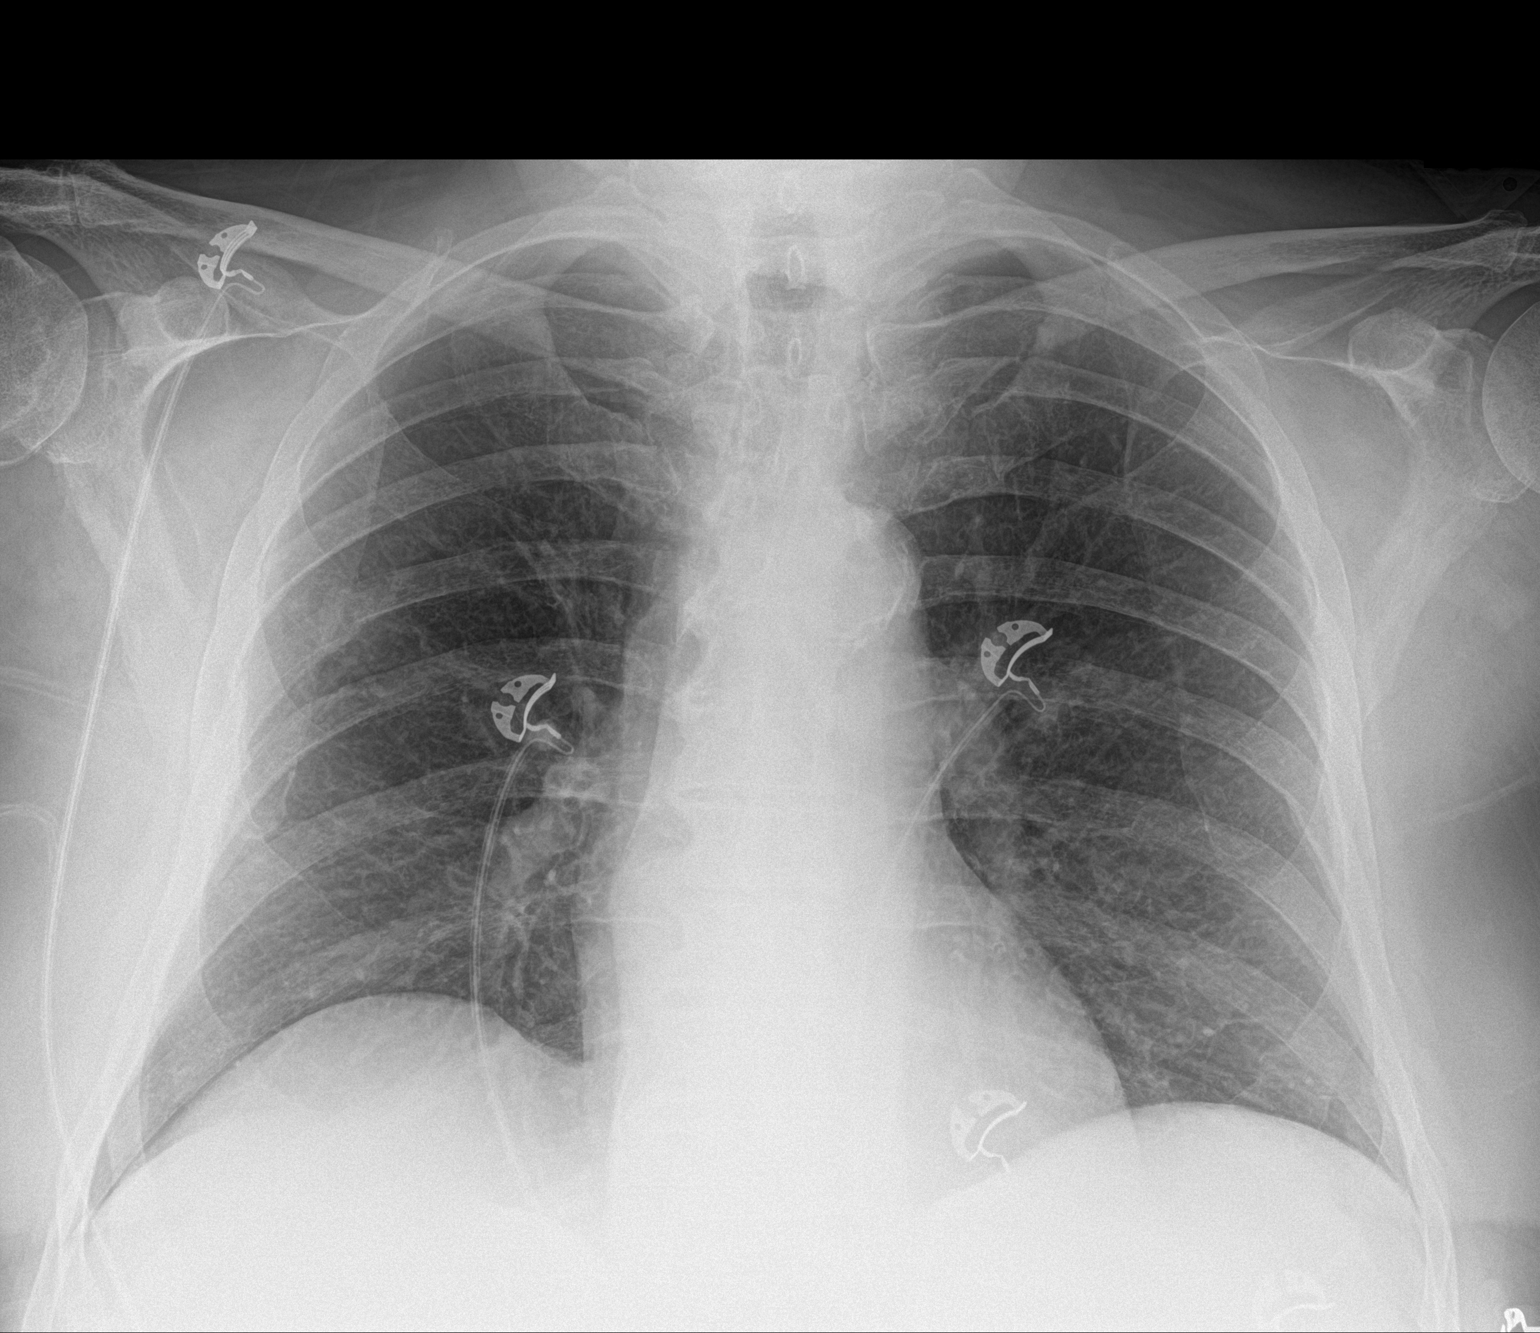

[2 of 2 positions shown; findings below may reference images not displayed]

FINDINGS: Atherosclerotic aortic arch.  Heart size within normal limits.

Thoracic spondylosis. Right posterolateral sixth rib deformities
suggesting old fracture. Old left healed rib fractures noted
laterally.

No pleural effusion.
IMPRESSION: 1. Old bilateral healed rib fractures. Thoracic spondylosis. No
acute findings.
2. Atherosclerotic calcification of the aortic arch.

## 2018-03-06 IMAGING — CT CT ABD-PELV W/ CM
2 of 5 series · 14 of 46 positions shown, 16 images · IV contrast (APPLIED)
Comparison: 02/27/2016 chest x-ray

CLINICAL DATA: Recent multiple falls, chronic abdominal pain, worse
in the left upper quadrant. Confusion.

EXAM:
CT ABDOMEN AND PELVIS WITH CONTRAST
TECHNIQUE: Multidetector CT imaging of the abdomen and pelvis was performed
using the standard protocol following bolus administration of
intravenous contrast.
CONTRAST:  100mL 9JKOR9-AHH IOPAMIDOL (9JKOR9-AHH) INJECTION 61%

[Series 2: axial st · axial · 0.92mm/px · z∈[-833,-358]mm · 11 of 109 slices shown, 13 images]
[im 7/109  soft-tissue]
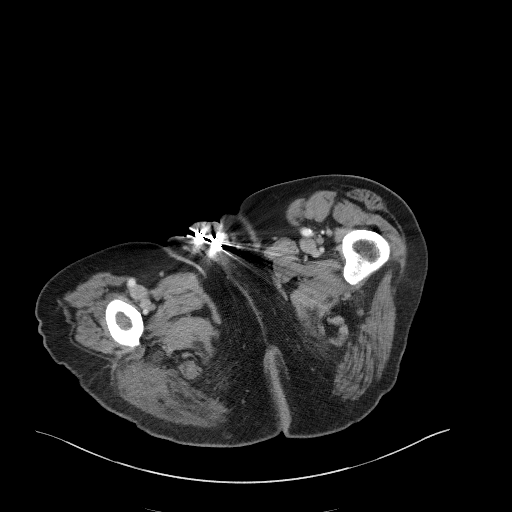
[im 7/109  bone]
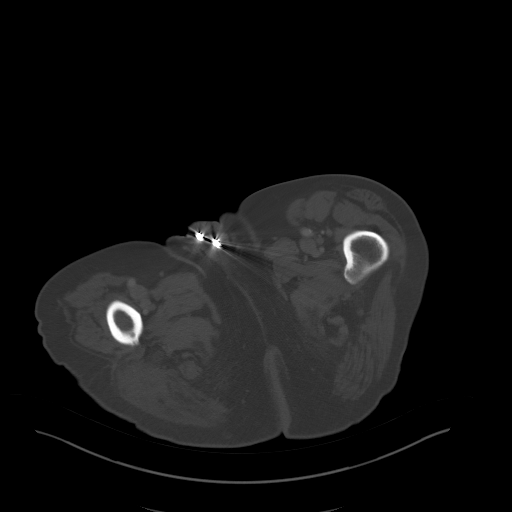
[im 20/109  soft-tissue]
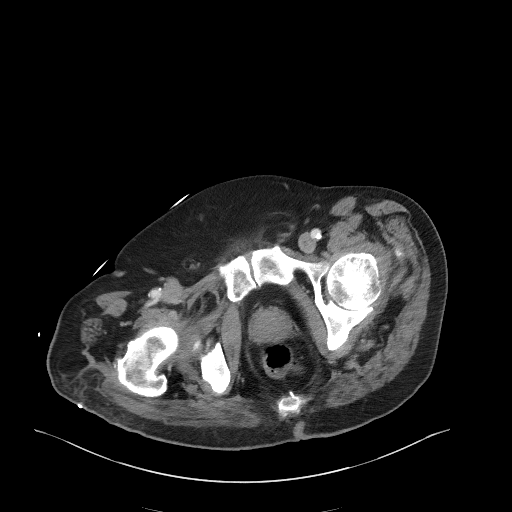
[im 26/109  soft-tissue]
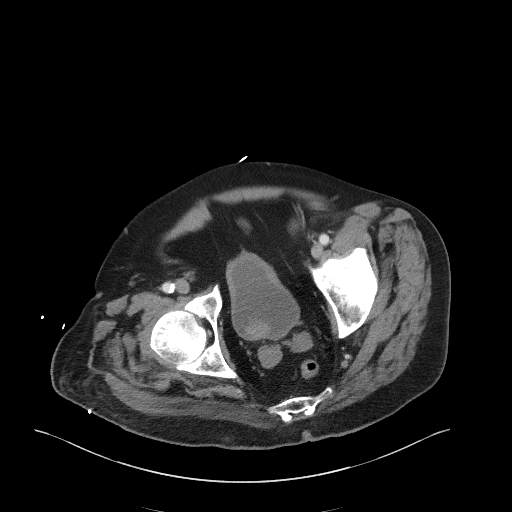
[im 39/109  soft-tissue]
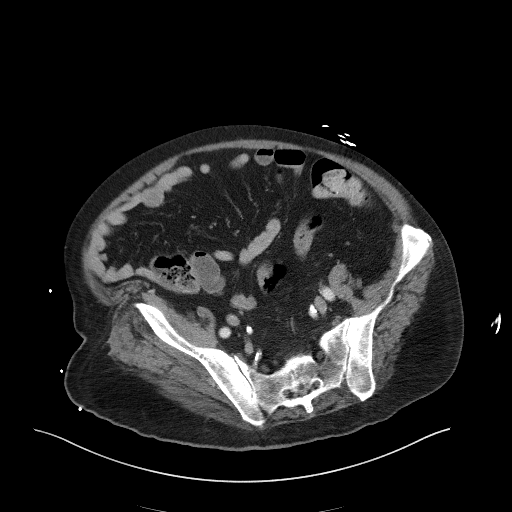
[im 45/109  soft-tissue]
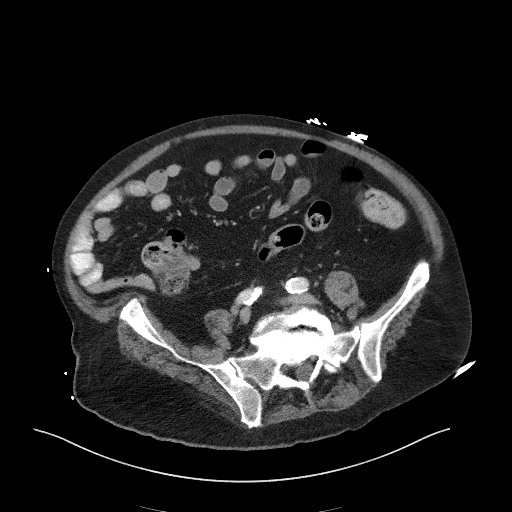
[im 58/109  soft-tissue]
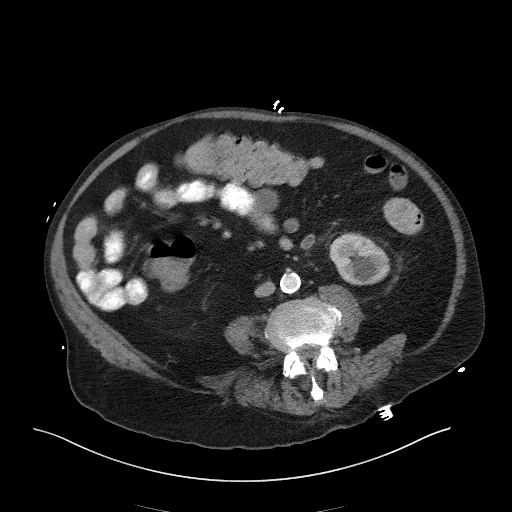
[im 64/109  soft-tissue]
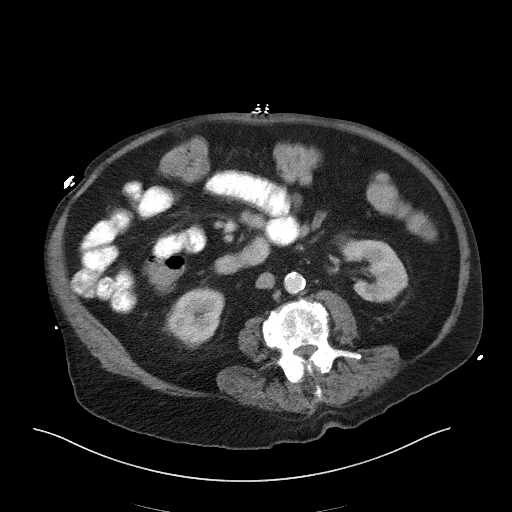
[im 70/109  soft-tissue]
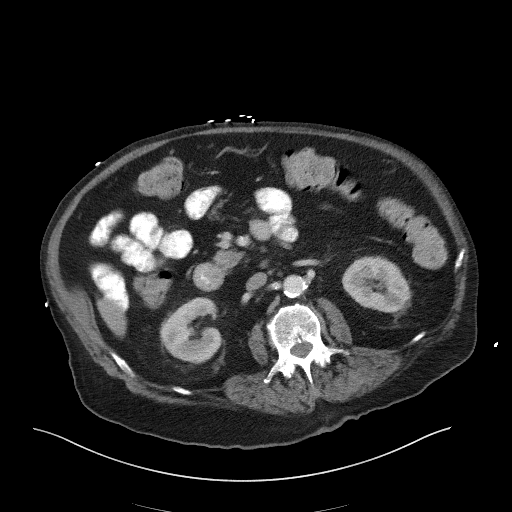
[im 83/109  soft-tissue]
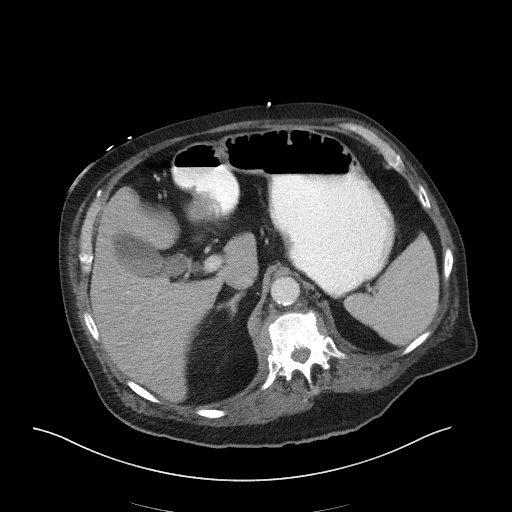
[im 83/109  bone]
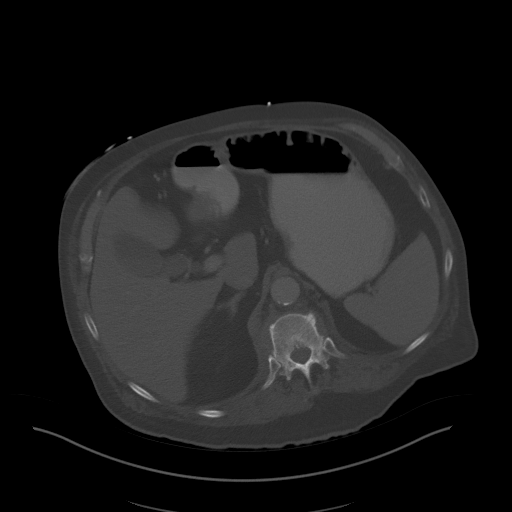
[im 89/109  soft-tissue]
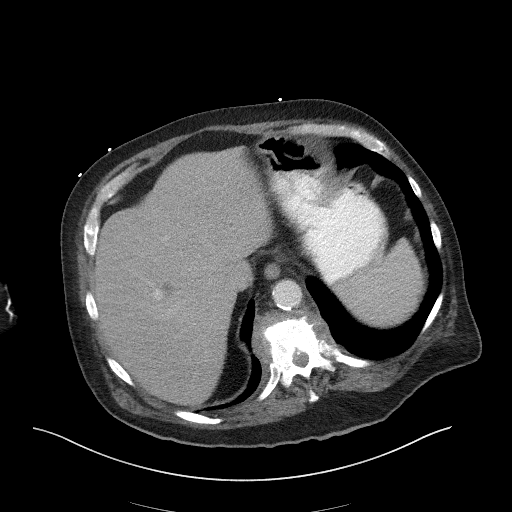
[im 102/109  soft-tissue]
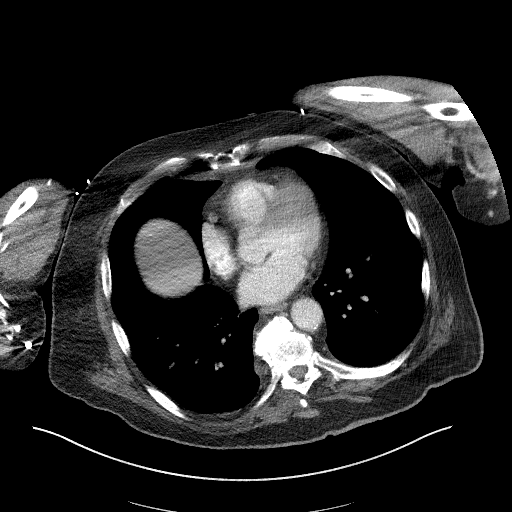

[Series 5: coronal st · coronal · 0.73mm/px · 3 of 102 slices shown]
[im 34/102  soft-tissue]
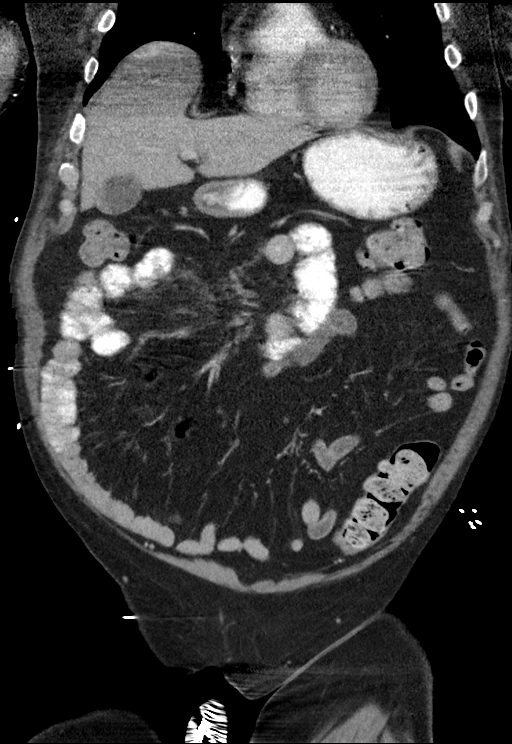
[im 45/102  soft-tissue]
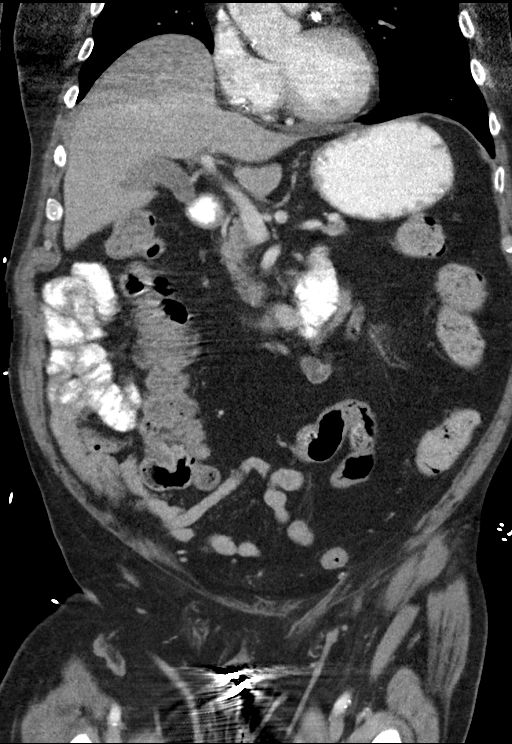
[im 57/102  soft-tissue]
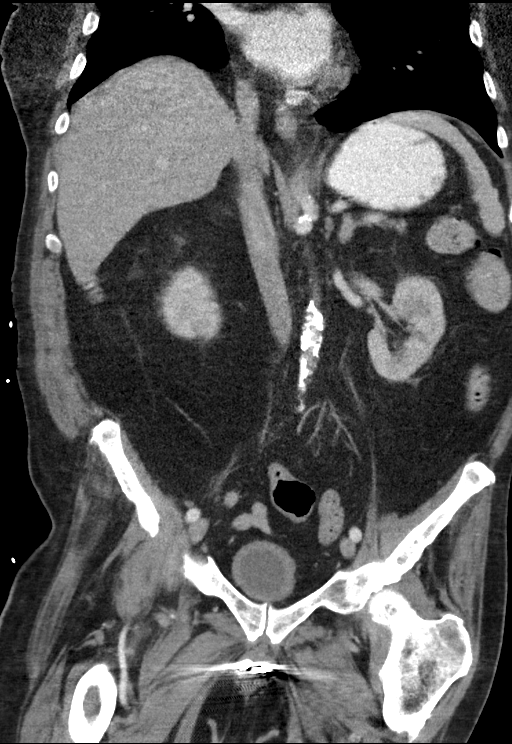

[14 of 46 positions shown; findings below may reference images not displayed]

FINDINGS: Lower chest: Minor right base dependent atelectasis. Lung bases
otherwise clear. No pericardial or pleural effusion. Native coronary
atherosclerosis. Lower thoracic aortic atherosclerosis as well.

Hepatobiliary: No focal hepatic abnormality or intrahepatic biliary
dilatation. Patent portal vein. Layering slight hyper attenuation
within the gallbladder, suspicious for gallstones or sludge.

Pancreas: Very thin in appearance, mildly atrophic. No focal
abnormality or ductal dilatation.

Spleen: Normal in size without focal abnormality.

Adrenals/Urinary Tract: Normal adrenal glands. Scattered renal
hypodense cysts larger on the left. Largest left renal cyst measures
28 mm, image 49. No renal obstruction or hydronephrosis. No ureteral
dilatation or ureteral calculus. Bladder demonstrates a focal right
posterior wall nodule/mass measuring 2.5 cm. Recommend follow-up
nonemergent urologic consultation for cystoscopy evaluation.

Stomach/Bowel: Negative for bowel obstruction, significant
dilatation, ileus, or free air. Appendix not visualized. No fluid
collection or abscess. No ascites.

Vascular/Lymphatic: Extensive aortoiliac atherosclerosis without
aneurysm or occlusive process. No retroperitoneal hemorrhage or
hematoma. No adenopathy.

Reproductive: Prostate normal in size. Seminal vesicles symmetric.
Penile prosthesis noted.

Other: No abdominal wall hernia or abnormality. No abdominopelvic
ascites.

Musculoskeletal: T11-12 surrounding paraspinous soft tissue
abnormality with edema and enhancement. Endplate irregularity at T11
and T12. Appearance compatible with T11-12 spondylo discitis. There
is associated inferior endplate fracture at T11. No severe
compression deformity or focal kyphosis. By CT, there is the
suggestion of a small right paraspinous fluid collection measuring
18 x 9 mm, image 22. This is concerning for paraspinous abscess.

Diffuse degenerative changes of the lumbar spine with vacuum disc
phenomena at L5.
IMPRESSION: CT findings concerning for acute spondylo discitis at T11-12 with an
associated T11 inferior endplate fracture. Question early small
right paraspinous abscess at T11-12 measuring 18 x 9 mm. Consider
further evaluation with MRI without and with contrast.

Coronary and diffuse thoracoabdominal aortic atherosclerosis

Layering sludge versus stones in the gallbladder

Incidental renal cysts

2.5 cm right posterior bladder nodular mass warrants urologic
follow-up for cystoscopy.

These results were called by telephone at the time of interpretation
on 02/27/2016 at [DATE] to Dr. RECEP RUNA , who verbally
acknowledged these results.
